# Patient Record
Sex: Male | Born: 1965 | Race: White | Hispanic: No | Marital: Single | Smoking: Current every day smoker
Health system: Southern US, Community
[De-identification: ages and names within clinical notes are randomized; demographics above are authoritative.]

## PROBLEM LIST (undated history)

## (undated) DIAGNOSIS — Z720 Tobacco use: Secondary | ICD-10-CM

## (undated) DIAGNOSIS — E785 Hyperlipidemia, unspecified: Secondary | ICD-10-CM

## (undated) DIAGNOSIS — K219 Gastro-esophageal reflux disease without esophagitis: Secondary | ICD-10-CM

## (undated) DIAGNOSIS — M542 Cervicalgia: Secondary | ICD-10-CM

## (undated) DIAGNOSIS — G8929 Other chronic pain: Secondary | ICD-10-CM

## (undated) DIAGNOSIS — Z9289 Personal history of other medical treatment: Secondary | ICD-10-CM

## (undated) HISTORY — DX: Other chronic pain: G89.29

## (undated) HISTORY — DX: Hyperlipidemia, unspecified: E78.5

## (undated) HISTORY — DX: Personal history of other medical treatment: Z92.89

## (undated) HISTORY — DX: Tobacco use: Z72.0

## (undated) HISTORY — DX: Gastro-esophageal reflux disease without esophagitis: K21.9

## (undated) HISTORY — DX: Cervicalgia: M54.2

---

## 2000-10-11 ENCOUNTER — Encounter: Payer: Self-pay | Admitting: Internal Medicine

## 2000-10-11 ENCOUNTER — Ambulatory Visit (HOSPITAL_COMMUNITY): Admission: RE | Admit: 2000-10-11 | Discharge: 2000-10-11 | Payer: Self-pay | Admitting: Internal Medicine

## 2000-12-12 ENCOUNTER — Encounter: Admission: RE | Admit: 2000-12-12 | Discharge: 2001-01-01 | Payer: Self-pay | Admitting: Neurology

## 2003-01-18 ENCOUNTER — Encounter: Admission: RE | Admit: 2003-01-18 | Discharge: 2003-01-18 | Payer: Self-pay | Admitting: Occupational Medicine

## 2005-10-31 ENCOUNTER — Emergency Department (HOSPITAL_COMMUNITY): Admission: EM | Admit: 2005-10-31 | Discharge: 2005-10-31 | Payer: Self-pay | Admitting: Family Medicine

## 2007-07-08 ENCOUNTER — Emergency Department (HOSPITAL_COMMUNITY): Admission: EM | Admit: 2007-07-08 | Discharge: 2007-07-08 | Payer: Self-pay | Admitting: Emergency Medicine

## 2008-04-13 ENCOUNTER — Encounter: Admission: RE | Admit: 2008-04-13 | Discharge: 2008-04-13 | Payer: Self-pay | Admitting: Internal Medicine

## 2009-03-05 DIAGNOSIS — Z9289 Personal history of other medical treatment: Secondary | ICD-10-CM

## 2009-03-05 HISTORY — DX: Personal history of other medical treatment: Z92.89

## 2009-11-25 ENCOUNTER — Emergency Department (HOSPITAL_COMMUNITY): Admission: EM | Admit: 2009-11-25 | Discharge: 2009-11-25 | Payer: Self-pay | Admitting: Family Medicine

## 2009-11-25 ENCOUNTER — Emergency Department (HOSPITAL_COMMUNITY): Admission: EM | Admit: 2009-11-25 | Discharge: 2009-11-25 | Payer: Self-pay | Admitting: Emergency Medicine

## 2010-05-09 ENCOUNTER — Emergency Department (HOSPITAL_COMMUNITY)
Admission: EM | Admit: 2010-05-09 | Discharge: 2010-05-10 | Discharge status: Against Medical Advice | Payer: Self-pay | Attending: Emergency Medicine | Admitting: Emergency Medicine

## 2010-05-09 DIAGNOSIS — M79609 Pain in unspecified limb: Secondary | ICD-10-CM | POA: Insufficient documentation

## 2010-05-18 LAB — DIFFERENTIAL
Basophils Absolute: 0.1 10*3/uL (ref 0.0–0.1)
Basophils Relative: 1 % (ref 0–1)
Eosinophils Absolute: 0.2 10*3/uL (ref 0.0–0.7)
Eosinophils Relative: 3 % (ref 0–5)
Lymphocytes Relative: 38 % (ref 12–46)
Lymphs Abs: 2.6 10*3/uL (ref 0.7–4.0)
Monocytes Absolute: 0.7 10*3/uL (ref 0.1–1.0)
Monocytes Relative: 10 % (ref 3–12)
Neutro Abs: 3.3 10*3/uL (ref 1.7–7.7)
Neutrophils Relative %: 49 % (ref 43–77)

## 2010-05-18 LAB — POCT I-STAT, CHEM 8
BUN: 8 mg/dL (ref 6–23)
Calcium, Ion: 1.19 mmol/L (ref 1.12–1.32)
Chloride: 102 mEq/L (ref 96–112)
Creatinine, Ser: 0.8 mg/dL (ref 0.4–1.5)
Glucose, Bld: 96 mg/dL (ref 70–99)
HCT: 46 % (ref 39.0–52.0)
Hemoglobin: 15.6 g/dL (ref 13.0–17.0)
Potassium: 4.3 mEq/L (ref 3.5–5.1)
Sodium: 141 mEq/L (ref 135–145)
TCO2: 29 mmol/L (ref 0–100)

## 2010-05-18 LAB — POCT CARDIAC MARKERS
CKMB, poc: <1 ng/mL — ABNORMAL LOW (ref 1.0–8.0)
Myoglobin, poc: 47.1 ng/mL (ref 12–200)
Troponin i, poc: <0.05 ng/mL (ref 0.00–0.09)

## 2010-05-18 LAB — CBC
HCT: 42 % (ref 39.0–52.0)
Hemoglobin: 15.2 g/dL (ref 13.0–17.0)
MCH: 32.5 pg (ref 26.0–34.0)
MCHC: 36.2 g/dL — ABNORMAL HIGH (ref 30.0–36.0)
MCV: 89.7 fL (ref 78.0–100.0)
Platelets: 179 10*3/uL (ref 150–400)
RBC: 4.68 MIL/uL (ref 4.22–5.81)
RDW: 12.5 % (ref 11.5–15.5)
WBC: 6.8 10*3/uL (ref 4.0–10.5)

## 2010-05-18 LAB — HEPATIC FUNCTION PANEL
ALT: 27 U/L (ref 0–53)
AST: 23 U/L (ref 0–37)
Albumin: 3.8 g/dL (ref 3.5–5.2)
Alkaline Phosphatase: 51 U/L (ref 39–117)
Bilirubin, Direct: 0.1 mg/dL (ref 0.0–0.3)
Indirect Bilirubin: 0.5 mg/dL (ref 0.3–0.9)
Total Bilirubin: 0.6 mg/dL (ref 0.3–1.2)
Total Protein: 7 g/dL (ref 6.0–8.3)

## 2010-05-18 LAB — CK: Total CK: 35 U/L (ref 7–232)

## 2010-09-12 ENCOUNTER — Encounter: Payer: Self-pay | Admitting: Family Medicine

## 2010-09-12 DIAGNOSIS — Z720 Tobacco use: Secondary | ICD-10-CM | POA: Insufficient documentation

## 2010-09-12 DIAGNOSIS — K219 Gastro-esophageal reflux disease without esophagitis: Secondary | ICD-10-CM | POA: Insufficient documentation

## 2011-07-02 ENCOUNTER — Encounter: Payer: Self-pay | Admitting: Medical

## 2011-07-02 ENCOUNTER — Ambulatory Visit (INDEPENDENT_AMBULATORY_CARE_PROVIDER_SITE_OTHER): Payer: Managed Care, Other (non HMO) | Admitting: Medical

## 2011-07-02 VITALS — BP 100/60 | HR 58 | Temp 97.8°F | Resp 16 | Ht 65.5 in | Wt 135.0 lb

## 2011-07-02 DIAGNOSIS — M542 Cervicalgia: Secondary | ICD-10-CM

## 2011-07-02 DIAGNOSIS — E782 Mixed hyperlipidemia: Secondary | ICD-10-CM

## 2011-07-02 MED ORDER — NAPROXEN 500 MG PO TABS: 500.0000 mg | ORAL_TABLET | Freq: Two times a day (BID) | ORAL | Status: DC

## 2011-07-02 MED ORDER — CYCLOBENZAPRINE HCL 5 MG PO TABS: ORAL_TABLET | ORAL | Status: DC

## 2011-07-02 NOTE — Progress Notes (Signed)
Subjective:   HPI  Kevin Gibbs is a 46 y.o. male who presents as a new patient. Was seeing [facility] in [place] prior, but they closed their practice.  Thus, here to establish care as well as new c/o.  He notes left sided neck pain x 3 weeks, though it could be pulled muscle or pinched nerve.  He notes pain on left, radiates to head, gives bad headache, worse pickup up heavy objects.  Not particularly worse with neck ROM.  Chewing makes it worse at times.  Denies numbness or tingling in arms, no pain down arms.  He does note hx/o shoulder problem with finger numbness in remote past, but that resolved.  Denies neck or back injury.  Using Ibuprofen OTC 200mg , 3 tablets 3-4 times daily.  He does note hx/o crick in neck/wry neck in the past. Denies injury or trauma.  He has hx/o high cholesterol, on medication, last check several months ago.  No other aggravating or relieving factors.    No other c/o.  The following portions of the patient's history were reviewed and updated as appropriate: allergies, current medications, past family history, past medical history, past social history, past surgical history and problem list.  Past Medical History  Diagnosis Date  . GERD (gastroesophageal reflux disease)   . Tobacco abuse   . Hyperlipidemia   . Migraine     last migrain in teens  . 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    Objective:   Physical Exam  General appearance: alert, no distress, WD/WN Oral cavity: MMM, no lesions Neck: supple, no lymphadenopathy, no thyromegaly, no masses, normal ROM, mild tenderness left posterior neck Heart: RRR, normal S1, S2, no murmurs Lungs: CTA bilaterally, no wheezes, rhonchi, or rales Pulses: 2+ symmetric Neuro: UE DTRs normal, sensation normal MSK: mild tenderness left trapezius, otherwise back, shoulders, UE  nontender, rest of UE unremarkable  Assessment and Plan :     Encounter Diagnoses  Name Primary?  . Cervicalgia Yes  . Mixed dyslipidemia    Cervicalgia - advised ROM exercises and stretching prior, during and after work, exercise in general, use heat prn, scripts for Flexeril and Naprosyn today.  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  Consider seeing a massage therapist.   Recheck if not improving.    Mixed dyslipidemia - he will return at his convenience for recheck including CMET, Lipid.

## 2011-08-01 ENCOUNTER — Encounter: Payer: Self-pay | Admitting: Medical

## 2011-08-01 ENCOUNTER — Ambulatory Visit (INDEPENDENT_AMBULATORY_CARE_PROVIDER_SITE_OTHER): Payer: Managed Care, Other (non HMO) | Admitting: Medical

## 2011-08-01 VITALS — BP 112/80 | HR 56 | Temp 97.7°F | Resp 16 | Wt 127.0 lb

## 2011-08-01 DIAGNOSIS — M542 Cervicalgia: Secondary | ICD-10-CM

## 2011-08-01 DIAGNOSIS — E785 Hyperlipidemia, unspecified: Secondary | ICD-10-CM

## 2011-08-01 DIAGNOSIS — F17200 Nicotine dependence, unspecified, uncomplicated: Secondary | ICD-10-CM

## 2011-08-01 DIAGNOSIS — R634 Abnormal weight loss: Secondary | ICD-10-CM

## 2011-08-01 LAB — COMPREHENSIVE METABOLIC PANEL
ALT: 15 U/L (ref 0–53)
AST: 19 U/L (ref 0–37)
Albumin: 4.5 g/dL (ref 3.5–5.2)
Alkaline Phosphatase: 50 U/L (ref 39–117)
BUN: 15 mg/dL (ref 6–23)
CO2: 27 mEq/L (ref 19–32)
Calcium: 9.1 mg/dL (ref 8.4–10.5)
Chloride: 106 mEq/L (ref 96–112)
Creat: 0.63 mg/dL (ref 0.50–1.35)
Glucose, Bld: 96 mg/dL (ref 70–99)
Potassium: 4.4 mEq/L (ref 3.5–5.3)
Sodium: 138 mEq/L (ref 135–145)
Total Bilirubin: 1 mg/dL (ref 0.3–1.2)
Total Protein: 6.4 g/dL (ref 6.0–8.3)

## 2011-08-01 LAB — LIPID PANEL
Cholesterol: 126 mg/dL (ref 0–200)
HDL: 39 mg/dL — ABNORMAL LOW (ref >39)
LDL Cholesterol: 73 mg/dL (ref 0–99)
Total CHOL/HDL Ratio: 3.2 Ratio
Triglycerides: 70 mg/dL (ref <150)
VLDL: 14 mg/dL (ref 0–40)

## 2011-08-02 ENCOUNTER — Encounter: Payer: Self-pay | Admitting: Medical

## 2011-08-02 LAB — CBC
HCT: 42.4 % (ref 39.0–52.0)
Hemoglobin: 14.6 g/dL (ref 13.0–17.0)
MCH: 33.2 pg (ref 26.0–34.0)
MCHC: 34.4 g/dL (ref 30.0–36.0)
MCV: 96.4 fL (ref 78.0–100.0)
Platelets: 184 10*3/uL (ref 150–400)
RBC: 4.4 MIL/uL (ref 4.22–5.81)
RDW: 13.5 % (ref 11.5–15.5)
WBC: 8.2 10*3/uL (ref 4.0–10.5)

## 2011-08-02 NOTE — Progress Notes (Signed)
Subjective:   HPI  Kevin Gibbs is a 46 y.o. male who presents for recheck.  I saw him as a new patient recently for neck pain.  I had advised he work on daily stretching and ROM exercise prior to, during, and after work. He works as a Curator.  I also prescribed muscle relaxer and NSAID.  He has been using the stretching and ROM with improvement.  Has used the medication some with improvement.    He is here for fasting labs given his hx/o hyperlipidemia.  He is compliant with his medication.  Was originally put on medication for elevated triglycerides 1.5 years ago.  His mother has hx/o heart disease.  No other heart disease in the family.   In the last several months he notes decreased appetite and weight loss.  Had been going through some stress and mood had been down.  No additional weight loss at current.   No other aggravating or relieving factors.    No other c/o.  The following portions of the patient's history were reviewed and updated as appropriate: allergies, current medications, past family history, past medical history, past social history, past surgical history and problem list.  Past Medical History  Diagnosis Date  . GERD (gastroesophageal reflux disease)   . Tobacco abuse   . Hyperlipidemia   . Migraine     last migrain in teens  . 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    Objective:   Physical Exam  General appearance: alert, no distress, WD/WN, lean white male Skin: multiple tattoos Oral cavity: MMM, no lesions Neck: supple,nontender today, mild pain with ROM, but relatively full ROM, no lymphadenopathy, no thyromegaly, no masses Heart: RRR, normal S1, S2, no murmurs Lungs: CTA bilaterally, no wheezes, rhonchi, or rales Abdomen: +bs, soft, non tender, non distended, no masses, no hepatomegaly, no splenomegaly Pulses: 2+  symmetric Back: nontender MSK: UE nontender, normal ROM   Assessment and Plan :     Encounter Diagnoses  Name Primary?  . Hyperlipidemia Yes  . Current smoker   . Cervicalgia   . Weight loss    Hyperlipidemia - labs today  Current smoker - discussed dangers to tobacco use, advised smoking cessation  Cervicalgia - improved using daily stretching and ROM.  Uses muscle relaxer and NSAID periodically but not daily.  He will call/return if worsening.  Weight loss - baseline labs today.  He notes recent stressors and decreased mood and subsequent decreased appetite that let to the weight loss, but will c/t to watch this.

## 2011-08-03 ENCOUNTER — Other Ambulatory Visit: Payer: Self-pay | Admitting: Family Medicine

## 2011-08-03 DIAGNOSIS — E785 Hyperlipidemia, unspecified: Secondary | ICD-10-CM

## 2011-09-04 ENCOUNTER — Telehealth: Payer: Self-pay | Admitting: Medical

## 2011-09-04 ENCOUNTER — Other Ambulatory Visit: Payer: Self-pay | Admitting: Medical

## 2011-09-04 MED ORDER — CYCLOBENZAPRINE HCL 5 MG PO TABS: ORAL_TABLET | ORAL | Status: DC

## 2011-09-04 NOTE — Telephone Encounter (Signed)
Lmom notifying the patient that his RX was sent to the pharmacy. CLS

## 2011-09-04 NOTE — Telephone Encounter (Signed)
rx sent

## 2011-11-23 ENCOUNTER — Ambulatory Visit (INDEPENDENT_AMBULATORY_CARE_PROVIDER_SITE_OTHER): Payer: Managed Care, Other (non HMO) | Admitting: Family Medicine

## 2011-11-23 ENCOUNTER — Encounter: Payer: Self-pay | Admitting: Family Medicine

## 2011-11-23 VITALS — BP 112/72 | HR 60 | Wt 129.0 lb

## 2011-11-23 DIAGNOSIS — G8929 Other chronic pain: Secondary | ICD-10-CM

## 2011-11-23 DIAGNOSIS — M542 Cervicalgia: Secondary | ICD-10-CM

## 2011-11-23 DIAGNOSIS — H00019 Hordeolum externum unspecified eye, unspecified eyelid: Secondary | ICD-10-CM

## 2011-11-23 DIAGNOSIS — Z23 Encounter for immunization: Secondary | ICD-10-CM

## 2011-11-23 DIAGNOSIS — E785 Hyperlipidemia, unspecified: Secondary | ICD-10-CM

## 2011-11-23 LAB — LIPID PANEL
Cholesterol: 246 mg/dL — ABNORMAL HIGH (ref 0–200)
HDL: 36 mg/dL — ABNORMAL LOW (ref >39)
LDL Cholesterol: 187 mg/dL — ABNORMAL HIGH (ref 0–99)
Total CHOL/HDL Ratio: 6.8 Ratio
Triglycerides: 113 mg/dL (ref <150)
VLDL: 23 mg/dL (ref 0–40)

## 2011-11-23 NOTE — Progress Notes (Signed)
  Subjective:    Patient ID: Kevin Gibbs, male    DOB: August 22, 1965, 46 y.o.   MRN: [id number]  HPI He is here for followup on neck pain and cholesterol. On his last visit his medication for cholesterol was stopped. He is here to get baseline information. He also continues to have difficulty with neck pain. It dates to at least 2002 when he apparently injured his neck at work. Review his record indicates no MRI of the C-spine was done in 2002. Since then he has treating this with stretching and range of motion. He was seen in July and given Flexeril. He will use the Flexeril for several days until the neck pain diminishes and then will not use it again for several months He also has a stye in the left eye and needs this evaluated.  Review of Systems     Objective:   Physical Exam Alert and in no distress. Good motion of the neck. Normal strength is noted in the arms with normal sensation. Very small stye is noted in the left lateral upper eyelid.     Assessment & Plan:   1. Hyperlipidemia  Lipid panel  2. Chronic neck pain    3. Stye    recommend continue with present conservative treatment for his neck pain. I will renew his Flexeril on an as-needed basis since he is using very little of it. Recommend warm soaks for the eye. Flu shot given. Risk and benefits discussed.

## 2011-11-26 ENCOUNTER — Other Ambulatory Visit: Payer: Self-pay | Admitting: *Deleted

## 2011-11-26 DIAGNOSIS — E7800 Pure hypercholesterolemia, unspecified: Secondary | ICD-10-CM

## 2011-11-26 DIAGNOSIS — E782 Mixed hyperlipidemia: Secondary | ICD-10-CM

## 2011-11-26 MED ORDER — ATORVASTATIN CALCIUM 20 MG PO TABS: 20.0000 mg | ORAL_TABLET | Freq: Every day | ORAL | Status: DC

## 2011-11-26 NOTE — Telephone Encounter (Signed)
Called in lipitor 20mg  to [facility] [address]-also scheduled him for 01/30/12 for fasting lipids.

## 2012-01-30 ENCOUNTER — Encounter: Payer: Self-pay | Admitting: Medical

## 2012-01-30 ENCOUNTER — Ambulatory Visit (INDEPENDENT_AMBULATORY_CARE_PROVIDER_SITE_OTHER): Payer: Managed Care, Other (non HMO) | Admitting: Medical

## 2012-01-30 ENCOUNTER — Other Ambulatory Visit: Payer: Managed Care, Other (non HMO)

## 2012-01-30 ENCOUNTER — Telehealth: Payer: Self-pay | Admitting: Family Medicine

## 2012-01-30 VITALS — BP 92/70 | HR 68 | Temp 97.9°F | Resp 16 | Ht 65.5 in | Wt 131.0 lb

## 2012-01-30 DIAGNOSIS — Z23 Encounter for immunization: Secondary | ICD-10-CM

## 2012-01-30 DIAGNOSIS — M542 Cervicalgia: Secondary | ICD-10-CM

## 2012-01-30 DIAGNOSIS — E785 Hyperlipidemia, unspecified: Secondary | ICD-10-CM

## 2012-01-30 DIAGNOSIS — G8929 Other chronic pain: Secondary | ICD-10-CM

## 2012-01-30 DIAGNOSIS — F17200 Nicotine dependence, unspecified, uncomplicated: Secondary | ICD-10-CM

## 2012-01-30 DIAGNOSIS — Z0000 Encounter for general adult medical examination without abnormal findings: Secondary | ICD-10-CM

## 2012-01-30 LAB — POCT URINALYSIS DIPSTICK
Bilirubin, UA: NEGATIVE
Blood, UA: NEGATIVE
Glucose, UA: NEGATIVE
Ketones, UA: NEGATIVE
Leukocytes, UA: NEGATIVE
Nitrite, UA: NEGATIVE
Protein, UA: NEGATIVE
Spec Grav, UA: <=1.005
Urobilinogen, UA: NEGATIVE
pH, UA: 7

## 2012-01-30 MED ORDER — CYCLOBENZAPRINE HCL 5 MG PO TABS: ORAL_TABLET | ORAL | Status: DC

## 2012-01-30 NOTE — Progress Notes (Addendum)
Subjective:   HPI  Kevin Gibbs is a 46 y.o. male who presents for a complete physical.  Preventative care: Last ophthalmology visit: 2013/Dr. Mcfarland Last dental visit:n/a Last colonoscopy:n/a Last prostate exam: never Last YNW:GNFAOZ test a few years ago Last labs:11/26/11  Prior vaccinations: TD or Tdap:2 to 3 years ago Influenza:11/2011 Pneumococcal:n/a Shingles/Zostavax:n/a  Concerns: Recheck on cholesterol today, compliant with medication  Ongoing neck pain, intermittent, uses flexeril occasionally. No arm numbness, tingling, weakness.  Reviewed their medical, surgical, family, social, medication, and allergy history and updated chart as appropriate.   Past Medical History  Diagnosis Date  . GERD (gastroesophageal reflux disease)   . Tobacco abuse   . Hyperlipidemia   . Migraine     last migrain in teens  . 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 History of exercise stress test 2011    per pt, reportedly normal  . Chronic neck pain     History reviewed. No pertinent past surgical history.  Family History  Problem Relation Age of Onset  . Diabetes Mother   . Hypertension Mother   . Heart disease Mother 75    CHF  . Heart disease Father     died of CHF  . Stroke Neg Hx   . Cancer Neg Hx     History   Social History  . Marital Status: Single    Spouse Name: N/A    Number of Children: N/A  . Years of Education: N/A   Occupational History  . Museum/gallery exhibitions officer course   Social History Main Topics  . Smoking status: Current Every Day Smoker -- 1.0 packs/day for 25 years    Types: Cigarettes  . Smokeless tobacco: Not on file  . Alcohol Use: 1.2 oz/week    2 Shots of liquor per week  . Drug Use: No  . Sexually Active: Not on file   Other Topics Concern  . Not on file   Social History Narrative   Single, in a relationship, has no children, exercising with walking, on the go all the time, cuts wood, yard work.  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 atorvastatin (LIPITOR) 20 MG tablet Take 1 tablet (20 mg total) by mouth daily.  30 tablet  1  . naproxen (NAPROSYN) 500 MG tablet Take 1 tablet (500 mg total) by mouth 2 (two) times daily with a meal.  30 tablet  0  . [DISCONTINUED] cyclobenzaprine (FLEXERIL) 5 MG tablet Use 1 tablet QHS or up to BID prn  20 tablet  0    No Known Allergies   Review of Systems Constitutional: -fever, -chills, -sweats, -unexpected weight change, -decreased appetite, -fatigue Allergy: -sneezing, -itching, -congestion Dermatology: -changing moles, --rash, -lumps ENT: -runny nose, -ear pain, -sore throat, -hoarseness, -sinus pain, -teeth pain, - ringing in ears, -hearing loss, -nosebleeds Cardiology: -chest pain, -palpitations, -swelling, -difficulty breathing when lying flat, -waking up short of breath Respiratory: -cough, -shortness of breath, -difficulty breathing with exercise or exertion, -wheezing, -coughing up blood Gastroenterology: -abdominal pain, -nausea, -vomiting, -diarrhea, -constipation, -blood in stool, -changes in bowel movement, -difficulty swallowing or eating Hematology: -bleeding, -bruising  Musculoskeletal: -joint aches, -muscle aches, -joint swelling, -back pain, -neck pain, -cramping, -changes in gait Ophthalmology: denies vision  changes, eye redness, itching, discharge Urology: -burning with urination, -difficulty urinating, -blood in urine, -urinary frequency, -urgency, -incontinence Neurology: -headache, -weakness, -tingling, -numbness, -memory loss, -falls, -dizziness Psychology: -depressed mood, -agitation, -sleep problems     Objective:   Physical Exam  Reviewed nurse notes and vitals  General appearance: alert, no distress, WD/WN, white male Skin: tattoos bilat arms, upper left back,  left 3rd toe dorsal proximal phalanx with small flat brown 3mm macule HEENT: normocephalic, conjunctiva/corneas normal, sclerae anicteric, PERRLA, EOMi, nares patent, no discharge or erythema, pharynx normal Oral cavity: MMM, tongue normal, teeth with stain, some caries noted Neck: supple, no lymphadenopathy, no thyromegaly, no masses, normal ROM, no bruits Chest: non tender, normal shape and expansion Heart: RRR, normal S1, S2, no murmurs Lungs: CTA bilaterally, no wheezes, rhonchi, or rales Abdomen: +bs, soft, non tender, non distended, no masses, no hepatomegaly, no splenomegaly, no bruits Back: non tender, normal ROM, no scoliosis Musculoskeletal: upper extremities non tender, no obvious deformity, normal ROM throughout, lower extremities non tender, no obvious deformity, normal ROM throughout Extremities: no edema, no cyanosis, no clubbing Pulses: 2+ symmetric, upper and lower extremities, normal cap refill Neurological: alert, oriented x 3, CN2-12 intact, strength normal upper extremities and lower extremities, sensation normal throughout, DTRs 2+ throughout, no cerebellar signs, gait normal Psychiatric: normal affect, behavior normal, pleasant  GU: normal male external genitalia, nontender, small spermatocele right inferior scrotum, no masses, no hernia, no lymphadenopathy Rectal: deferred   Assessment and Plan :      Encounter Diagnoses  Name Primary?  . Routine general medical examination at a health care facility Yes  . Hyperlipidemia   . Tobacco use disorder   . Chronic neck pain   . Need for pneumococcal vaccination     Physical exam - discussed healthy lifestyle, diet, exercise, preventative care, vaccinations, and addressed their concerns.  We will try and get copy of prior cardiology records.   Hyperlipidemia - on Lipitor.  Labs today.   Was on simcor prior with good results but changed due to costs.  Tobacco use - again advised he quit.  discussed risks.  He is not  ready to quit at this time.  Chronic neck pain - refilled flexeril which helps.  In near future, we can get updated C spine xray.  Pneumococcal vaccine, VIS and counseling given.   Follow-up pending labs, repeat first morning urine for trace protein in 2-3 wk.

## 2012-01-30 NOTE — Telephone Encounter (Signed)
Message copied by Janeice Robinson on Wed Jan 30, 2012  3:50 PM ------      Message from: Aleen Campi, DAVID S      Created: Wed Jan 30, 2012 12:35 PM       pls call [facility] and/or [facility] about prior stress test - need copy

## 2012-01-30 NOTE — Telephone Encounter (Signed)
Done

## 2012-01-31 LAB — LIPID PANEL
Cholesterol: 143 mg/dL (ref 0–200)
HDL: 37 mg/dL — ABNORMAL LOW (ref >39)
LDL Cholesterol: 89 mg/dL (ref 0–99)
Total CHOL/HDL Ratio: 3.9 Ratio
Triglycerides: 86 mg/dL (ref <150)
VLDL: 17 mg/dL (ref 0–40)

## 2012-01-31 LAB — ALT: ALT: 17 U/L (ref 0–53)

## 2012-01-31 LAB — VITAMIN D 25 HYDROXY (VIT D DEFICIENCY, FRACTURES): Vit D, 25-Hydroxy: 30 ng/mL (ref 30–89)

## 2012-02-04 ENCOUNTER — Other Ambulatory Visit: Payer: Self-pay | Admitting: Medical

## 2012-02-04 DIAGNOSIS — E7800 Pure hypercholesterolemia, unspecified: Secondary | ICD-10-CM

## 2012-02-04 MED ORDER — ATORVASTATIN CALCIUM 20 MG PO TABS: 20.0000 mg | ORAL_TABLET | Freq: Every day | ORAL | Status: DC

## 2012-02-18 ENCOUNTER — Ambulatory Visit (INDEPENDENT_AMBULATORY_CARE_PROVIDER_SITE_OTHER): Payer: Managed Care, Other (non HMO) | Admitting: Medical

## 2012-02-18 ENCOUNTER — Encounter: Payer: Self-pay | Admitting: Medical

## 2012-02-18 VITALS — BP 100/60 | HR 58 | Temp 98.1°F | Resp 14 | Wt 135.0 lb

## 2012-02-18 DIAGNOSIS — Z720 Tobacco use: Secondary | ICD-10-CM

## 2012-02-18 DIAGNOSIS — J329 Chronic sinusitis, unspecified: Secondary | ICD-10-CM

## 2012-02-18 DIAGNOSIS — F17200 Nicotine dependence, unspecified, uncomplicated: Secondary | ICD-10-CM

## 2012-02-18 LAB — POCT RAPID STREP A (OFFICE): Rapid Strep A Screen: NEGATIVE

## 2012-02-18 MED ORDER — AMOXICILLIN 875 MG PO TABS: 875.0000 mg | ORAL_TABLET | Freq: Two times a day (BID) | ORAL | Status: DC

## 2012-02-18 NOTE — Progress Notes (Signed)
Subjective:  Kevin Gibbs is a 46 y.o. male who presents for 1 wk hx/o illness.  Having sinus pressure, face hurts, teeth pain, left ear pain, some sore throat and cough, moving into chest.  He is a smoker.  Sick contacts at home as well.  Using severe cold and allergy medication OTC with no relief.  Avoids nasal saline as this causes him to gag and vomit. No chills, body aches, fever.  No other aggravating or relieving factors.  No other c/o.  Past Medical History  Diagnosis Date  . GERD (gastroesophageal reflux disease)   . Tobacco abuse   . Hyperlipidemia   . Migraine     last migrain in teens  . 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 History of exercise stress test 2011    per pt, reportedly normal  . Chronic neck pain    ROS as in HPI  Objective:   Filed Vitals:   02/18/12 1026  BP: 100/60  Pulse: 58  Temp: 98.1 F (36.7 C)  Resp: 14    General appearance: Alert, WD/WN, no distress                             Skin: warm, no rash                           Head: +frontal sinus tenderness,                            Eyes: conjunctiva normal, corneas clear, PERRLA                            Ears: pearly TMs, external ear canals normal                          Nose: septum midline, turbinates swollen, with erythema and clear discharge             Mouth/throat: MMM, tongue normal, mild pharyngeal erythema                           Neck: supple, no adenopathy, no thyromegaly, nontender                          Heart: RRR, normal S1, S2, no murmurs                         Lungs: CTA bilaterally, no wheezes, rales, or rhonchi      Assessment and Plan:   Encounter Diagnoses  Name Primary?  . Sinusitis Yes  . Tobacco use    Prescription given for Amoxicillin.  Can use OTC Mucinex for congestion, humidifier.  Tylenol or Ibuprofen OTC for fever and malaise.  Discussed symptomatic relief and call or return if worse or not improving in 2-3 days.

## 2012-02-22 ENCOUNTER — Ambulatory Visit: Payer: Managed Care, Other (non HMO) | Admitting: Family Medicine

## 2012-03-31 ENCOUNTER — Encounter: Payer: Self-pay | Admitting: Family Medicine

## 2012-03-31 ENCOUNTER — Ambulatory Visit (INDEPENDENT_AMBULATORY_CARE_PROVIDER_SITE_OTHER): Payer: Managed Care, Other (non HMO) | Admitting: Family Medicine

## 2012-03-31 VITALS — BP 100/70 | HR 58 | Wt 133.0 lb

## 2012-03-31 DIAGNOSIS — M542 Cervicalgia: Secondary | ICD-10-CM

## 2012-03-31 MED ORDER — PREDNISONE 10 MG PO KIT: PACK | ORAL | Status: DC

## 2012-03-31 NOTE — Progress Notes (Signed)
  Subjective:    Patient ID: Kevin Gibbs, male    DOB: 08-Apr-1965, 47 y.o.   MRN: [id number]  HPI 3 weeks ago he was starting an engine with a pull rope and experienced pain 2 days later in the right neck area. 7-10 days after that he noted radiation down the right arm into the thumb. He describes this as a tingling sensation no weakness hearing he's been using heat massage and ice   Review of Systems     Objective:   Physical Exam Alert and in no distress full motion of the neck. No tenderness to palpation. Compression test negative. Normal motor, sensory and DTRs.       Assessment & Plan:   1. Neck pain on right side  DG Cervical Spine Complete, PredniSONE 10 MG KIT   I will place him on a Sterapred dose pack. Discussed heat, range of motion exercises. Also discussed possible physical therapy as well as further testing with MRI. If no improvement with prednisone, he will return here in roughly 2 weeks.

## 2012-03-31 NOTE — Patient Instructions (Signed)
Heat for 20 minutes 3 times per day and after your done ,then do the stretching. As long as you  get better we are home free; if down the road after about 2 weeks you are not better,come back

## 2012-04-03 ENCOUNTER — Ambulatory Visit
Admission: RE | Admit: 2012-04-03 | Discharge: 2012-04-03 | Disposition: A | Discharge status: Home / Self Care | Payer: Managed Care, Other (non HMO) | Source: Ambulatory Visit | Attending: Family Medicine | Admitting: Family Medicine

## 2012-04-03 DIAGNOSIS — M542 Cervicalgia: Secondary | ICD-10-CM

## 2012-04-03 NOTE — Progress Notes (Signed)
Quick Note:  Let him know that he does have some degenerative changes. Have him call me next week to let he know how he is doing. ______

## 2012-04-04 NOTE — Progress Notes (Signed)
Quick Note:  Called pt to inform him of xray and he verbalized understanding and will call back next week ______

## 2012-04-14 ENCOUNTER — Telehealth: Payer: Self-pay | Admitting: Family Medicine

## 2012-04-14 NOTE — Telephone Encounter (Signed)
Have him set up an appointment for followup visit

## 2012-04-21 ENCOUNTER — Ambulatory Visit (INDEPENDENT_AMBULATORY_CARE_PROVIDER_SITE_OTHER): Payer: Managed Care, Other (non HMO) | Admitting: Family Medicine

## 2012-04-21 ENCOUNTER — Encounter: Payer: Self-pay | Admitting: Family Medicine

## 2012-04-21 VITALS — BP 106/70 | HR 61 | Wt 131.0 lb

## 2012-04-21 DIAGNOSIS — M542 Cervicalgia: Secondary | ICD-10-CM

## 2012-04-21 NOTE — Progress Notes (Signed)
  Subjective:    Patient ID: Kevin Gibbs, male    DOB: 27-Jun-1965, 47 y.o.   MRN: [id number]  HPI He is here for recheck. He continues to have stiffness in his neck especially on the right with tingling and numbness into his thumb. He was treated with a steroid Dosepak and this has had no benefit.   Review of Systems     Objective:   Physical Exam Alert and in no distress. Slight pain on right lateral neck motion. Normal sensory, motor and DTRs of the arms.       Assessment & Plan:  Cervicalgia discussed options including MRI versus physical therapy. I will proceed with an MRI and if unsuccessful we'll then order a physical therapy. He is in agreement with this.

## 2012-04-22 ENCOUNTER — Telehealth: Payer: Self-pay | Admitting: Family Medicine

## 2012-04-22 NOTE — Telephone Encounter (Signed)
I called pt to let him know Dr.Lalonde is sending him to p/t instead

## 2012-04-22 NOTE — Telephone Encounter (Signed)
Let's go ahead and set him up for physical therapy for his neck.

## 2012-04-23 ENCOUNTER — Other Ambulatory Visit: Payer: Managed Care, Other (non HMO)

## 2012-04-24 NOTE — Telephone Encounter (Signed)
PT INFORMED AND FAXED INFO TO [facility] TH[facility]OUGH P/T

## 2012-06-26 ENCOUNTER — Ambulatory Visit (INDEPENDENT_AMBULATORY_CARE_PROVIDER_SITE_OTHER): Payer: Managed Care, Other (non HMO) | Admitting: Family Medicine

## 2012-06-26 ENCOUNTER — Encounter: Payer: Self-pay | Admitting: Family Medicine

## 2012-06-26 VITALS — BP 140/70 | HR 68 | Wt 131.0 lb

## 2012-06-26 DIAGNOSIS — S60569A Insect bite (nonvenomous) of unspecified hand, initial encounter: Secondary | ICD-10-CM

## 2012-06-26 DIAGNOSIS — R590 Localized enlarged lymph nodes: Secondary | ICD-10-CM

## 2012-06-26 DIAGNOSIS — R599 Enlarged lymph nodes, unspecified: Secondary | ICD-10-CM

## 2012-06-26 DIAGNOSIS — S60562A Insect bite (nonvenomous) of left hand, initial encounter: Secondary | ICD-10-CM

## 2012-06-26 NOTE — Progress Notes (Signed)
  Subjective:    Patient ID: Kevin Gibbs, male    DOB: 1965/07/18, 47 y.o.   MRN: [id number]  HPI He had an insect bite on his left hand that is now healing. He also noted one on the left scrotal area and subsequent to this also noted swelling in the left inguinal area. He is having swelling as well as mainly itching at this point.   Review of Systems     Objective:   Physical Exam Exam of the hand shows a healing lesion at the base of the thumb. Left side of scrotum does show a healing lesion but there is no erythema warmth or tenderness. Testes normal. No hernia noted. Inguinal adenopathy is noted.       Assessment & Plan:  Lymphadenopathy, inguinal  Insect bite hand, left, initial encounter  I explained that this was probably related to inflammation and not infection. He is to treat this conservatively with heat, antihistamines and topical cortisone. If further difficulty, he is to call me.

## 2012-06-26 NOTE — Patient Instructions (Signed)
You can use heat on at 20 minutes a couple times per day. Use Benadryl at night for the itching and to Allegra or Claritin during the day. You can also use cortisone cream. If it gets worse give me a call

## 2012-07-01 NOTE — Progress Notes (Signed)
  Subjective:    Patient ID: Kevin Gibbs, male    DOB: 14-Sep-1965, 47 y.o.   MRN: [id number]  HPI    Review of Systems     Objective:   Physical Exam        Assessment & Plan:  The insect bite occurred approximately 5 days prior to his visit.

## 2012-07-28 ENCOUNTER — Emergency Department (HOSPITAL_COMMUNITY)
Admission: EM | Admit: 2012-07-28 | Discharge: 2012-07-28 | Disposition: A | Discharge status: Home / Self Care | Payer: Managed Care, Other (non HMO) | Attending: Emergency Medicine | Admitting: Emergency Medicine

## 2012-07-28 ENCOUNTER — Encounter (HOSPITAL_COMMUNITY): Payer: Self-pay | Admitting: Emergency Medicine

## 2012-07-28 DIAGNOSIS — Y9289 Other specified places as the place of occurrence of the external cause: Secondary | ICD-10-CM | POA: Insufficient documentation

## 2012-07-28 DIAGNOSIS — Y9389 Activity, other specified: Secondary | ICD-10-CM | POA: Insufficient documentation

## 2012-07-28 DIAGNOSIS — G8929 Other chronic pain: Secondary | ICD-10-CM | POA: Insufficient documentation

## 2012-07-28 DIAGNOSIS — Z7982 Long term (current) use of aspirin: Secondary | ICD-10-CM | POA: Insufficient documentation

## 2012-07-28 DIAGNOSIS — Z79899 Other long term (current) drug therapy: Secondary | ICD-10-CM | POA: Insufficient documentation

## 2012-07-28 DIAGNOSIS — F17200 Nicotine dependence, unspecified, uncomplicated: Secondary | ICD-10-CM | POA: Insufficient documentation

## 2012-07-28 DIAGNOSIS — W268XXA Contact with other sharp object(s), not elsewhere classified, initial encounter: Secondary | ICD-10-CM | POA: Insufficient documentation

## 2012-07-28 DIAGNOSIS — Z8679 Personal history of other diseases of the circulatory system: Secondary | ICD-10-CM | POA: Insufficient documentation

## 2012-07-28 DIAGNOSIS — S60459A Superficial foreign body of unspecified finger, initial encounter: Secondary | ICD-10-CM | POA: Insufficient documentation

## 2012-07-28 DIAGNOSIS — H9190 Unspecified hearing loss, unspecified ear: Secondary | ICD-10-CM | POA: Insufficient documentation

## 2012-07-28 DIAGNOSIS — M542 Cervicalgia: Secondary | ICD-10-CM | POA: Insufficient documentation

## 2012-07-28 DIAGNOSIS — Z8719 Personal history of other diseases of the digestive system: Secondary | ICD-10-CM | POA: Insufficient documentation

## 2012-07-28 DIAGNOSIS — S60450A Superficial foreign body of right index finger, initial encounter: Secondary | ICD-10-CM

## 2012-07-28 DIAGNOSIS — E785 Hyperlipidemia, unspecified: Secondary | ICD-10-CM | POA: Insufficient documentation

## 2012-07-28 MED ORDER — HYDROCODONE-ACETAMINOPHEN 5-325 MG PO TABS: 1.0000 | ORAL_TABLET | ORAL | Status: DC | PRN

## 2012-07-28 MED ORDER — AMOXICILLIN-POT CLAVULANATE 875-125 MG PO TABS
1.0000 | ORAL_TABLET | Freq: Once | ORAL | Status: AC
  Administered 2012-07-28: 1 via ORAL
  Filled 2012-07-28: qty 1

## 2012-07-28 MED ORDER — AMOXICILLIN-POT CLAVULANATE 875-125 MG PO TABS: 1.0000 | ORAL_TABLET | Freq: Two times a day (BID) | ORAL | Status: DC

## 2012-07-28 NOTE — ED Provider Notes (Signed)
Medical screening examination/treatment/procedure(s) were performed by non-physician practitioner and as supervising physician I was immediately available for consultation/collaboration.  Abdulla Pooley, MD 07/28/12 1157 

## 2012-07-28 NOTE — ED Notes (Signed)
Pt reports went fishing today and got a fish hook in his left pinky finger. Pt reports fish hook is new and he had last tetanus 3 years ago.

## 2012-07-28 NOTE — ED Provider Notes (Signed)
History     CSN: [id number]  Arrival date & time 07/28/12  0935   First MD Initiated Contact with Patient 07/28/12 [phone]      Chief Complaint  Patient presents with  . Foreign Body in Skin    (Consider location/radiation/quality/duration/timing/severity/associated sxs/prior treatment) HPI  Patient to the ED with complaints of fish hook to right index finger just prior to arrival. The fish hook got caught in the tree and while trying to get it out the branch fell causing the hook to go in his finger. It did not go all the way through and is partially in,. The hook was new but is dirty from fishing today. He had a tetanus shot 3 years ago. No allergies to abx, otherwise healthy.  Past Medical History  Diagnosis Date  . GERD (gastroesophageal reflux disease)   . Tobacco abuse   . Hyperlipidemia   . Migraine     last migrain in teens  . 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 History of exercise stress test 2011    per pt, reportedly normal  . Chronic neck pain     History reviewed. No pertinent past surgical history.  Family History  Problem Relation Age of Onset  . Diabetes Mother   . Hypertension Mother   . Heart disease Mother 65    CHF  . Heart disease Father     died of CHF  . Stroke Neg Hx   . Cancer Neg Hx     History  Substance Use Topics  . Smoking status: Current Every Day Smoker -- 1.00 packs/day for 25 years    Types: Cigarettes  . Smokeless tobacco: Not on file  . Alcohol Use: 1.2 oz/week    2 Shots of liquor per week      Review of Systems  All other systems reviewed and are negative.    Allergies  Review of patient's allergies indicates no known allergies.  Home Medications   Current Outpatient Rx  Name  Route  Sig  Dispense  Refill  . aspirin 81 MG tablet   Oral   Take 81 mg by mouth daily.         Marland Kitchen atorvastatin (LIPITOR) 20 MG tablet   Oral   Take 20 mg by mouth at bedtime.         . fish  oil-omega-3 fatty acids 1000 MG capsule   Oral   Take 1 g by mouth daily.         Marland Kitchen ibuprofen (ADVIL,MOTRIN) 200 MG tablet   Oral   Take 400 mg by mouth once.         Marland Kitchen VITAMIN D, CHOLECALCIFEROL, PO   Oral   Take 1 tablet by mouth daily.          Marland Kitchen amoxicillin-clavulanate (AUGMENTIN) 875-125 MG per tablet   Oral   Take 1 tablet by mouth every 12 (twelve) hours.   14 tablet   0   . HYDROcodone-acetaminophen (NORCO/VICODIN) 5-325 MG per tablet   Oral   Take 1 tablet by mouth every 4 (four) hours as needed for pain.   10 tablet   0     BP 139/91  Pulse 69  Temp(Src) 97.7 F (36.5 C) (Oral)  Resp 18  SpO2 98%  Physical Exam  Nursing note and vitals reviewed. Constitutional: He appears well-developed and well-nourished. No distress.  HENT:  Head: Normocephalic and atraumatic.  Eyes:  Pupils are equal, round, and reactive to light.  Neck: Normal range of motion. Neck supple.  Cardiovascular: Normal rate and regular rhythm.   Pulmonary/Chest: Effort normal.  Abdominal: Soft.  Musculoskeletal:       Hands: FROM of finger without pain before and after procedure. Radial and distal pulses are both physiologica  Neurological: He is alert.  Skin: Skin is warm and dry.    ED Course  FOREIGN BODY REMOVAL Date/Time: 07/28/2012 10:35 AM Performed by: Dorthula Matas Authorized by: Dorthula Matas Consent: Verbal consent obtained. Risks and benefits: risks, benefits and alternatives were discussed Consent given by: patient and spouse Patient understanding: patient states understanding of the procedure being performed Patient consent: the patient's understanding of the procedure matches consent given Time out: Immediately prior to procedure a "time out" was called to verify the correct patient, procedure, equipment, support staff and site/side marked as required. Body area: skin Anesthesia: local infiltration Local anesthetic: lidocaine 2% without  epinephrine Anesthetic total: 3 drops Patient sedated: no Removal mechanism: alligator forceps Tendon involvement: none Depth: deep 1 objects recovered. Objects recovered: 1 fish hook Post-procedure assessment: foreign body removed Patient tolerance: Patient tolerated the procedure well with no immediate complications.   (including critical care time)  Labs Reviewed - No data to display No results found.   1. Superficial foreign body of right index finger, initial encounter       MDM  Discussed with patient and spouse that he has high risk of infection to the finger. Even despite antibiotics infection is a complication of this sort of puncture wound. Augmentin abx given prophylactic ly.  Pt has been advised of the symptoms that warrant their return to the ED. Patient has voiced understanding and has agreed to follow-up with the PCP or specialist.         Dorthula Matas, PA-C 07/28/12 1038

## 2012-07-31 ENCOUNTER — Encounter: Payer: Self-pay | Admitting: Family Medicine

## 2012-07-31 ENCOUNTER — Ambulatory Visit (INDEPENDENT_AMBULATORY_CARE_PROVIDER_SITE_OTHER): Payer: Managed Care, Other (non HMO) | Admitting: Family Medicine

## 2012-07-31 VITALS — BP 110/60 | HR 57 | Wt 130.0 lb

## 2012-07-31 DIAGNOSIS — R590 Localized enlarged lymph nodes: Secondary | ICD-10-CM

## 2012-07-31 DIAGNOSIS — R599 Enlarged lymph nodes, unspecified: Secondary | ICD-10-CM

## 2012-07-31 DIAGNOSIS — T148 Other injury of unspecified body region: Secondary | ICD-10-CM

## 2012-07-31 DIAGNOSIS — W57XXXA Bitten or stung by nonvenomous insect and other nonvenomous arthropods, initial encounter: Secondary | ICD-10-CM

## 2012-07-31 NOTE — Progress Notes (Signed)
  Subjective:    Patient ID: Kevin Gibbs, male    DOB: [birth year], 47 y.o.   MRN: [id number]  HPI He is here for evaluation of the tick bite. He noticed this 2 days ago. It is in the left perianal area. It is causing some difficulty with itching. He also is noted some left inguinal swelling.   Review of Systems     Objective:   Physical Exam Exam of the perianal area does show a healing lesion with 2 cm of surrounding erythema. It is not warm or tender. Inguinal exam does show a slightly prominent left inguinal node that is round smooth and movable. No lesions were noted on the leg.       Assessment & Plan:  Tick bite  Inguinal adenopathy I discussed tick bite in regard to RMSF and Lyme disease. I did circle the lesion and asked him to call me if the erythema gets much worse. Explained that the adenopathy is nothing to be concerned about and not related to the tick bite.

## 2012-10-28 ENCOUNTER — Ambulatory Visit (INDEPENDENT_AMBULATORY_CARE_PROVIDER_SITE_OTHER): Payer: Managed Care, Other (non HMO) | Admitting: Medical

## 2012-10-28 ENCOUNTER — Encounter: Payer: Self-pay | Admitting: Medical

## 2012-10-28 VITALS — BP 118/76 | HR 56 | Temp 97.6°F | Resp 16 | Wt 131.0 lb

## 2012-10-28 DIAGNOSIS — R109 Unspecified abdominal pain: Secondary | ICD-10-CM

## 2012-10-28 DIAGNOSIS — R1030 Lower abdominal pain, unspecified: Secondary | ICD-10-CM

## 2012-10-28 LAB — POCT URINALYSIS DIPSTICK
Glucose, UA: NEGATIVE
Leukocytes, UA: NEGATIVE
Nitrite, UA: NEGATIVE
Urobilinogen, UA: NEGATIVE
pH, UA: 7

## 2012-10-28 NOTE — Progress Notes (Signed)
Subjective:  Kevin Gibbs is a 47 y.o. male who presents for left groin pain.  He reports pressure in left groin region.  Has been intermittent since he was seen for tick bites back in May.  Feels swollen in the area, but doesn't look swollen. Nothing seems to aggravate it particularly.  Left leg motions don't seem to interfere.  Denies hx/o hernia.  Denies swollen nodes.  Denies any stool or urination changes, no edema.  No fever, night sweats, no body aches.   No other aggravating or relieving factors.    No other c/o.  The following portions of the patient's history were reviewed and updated as appropriate: allergies, current medications, past family history, past medical history, past social history, past surgical history and problem list.  Review of Systems Constitutional: -fever, -chills, -sweats, -unexpected weight change,-fatigue Gastroenterology: -abdominal pain, -nausea, -vomiting, -diarrhea, -constipation Hematology: -bleeding or bruising problems Musculoskeletal: -arthralgias, -joint swelling, -back pain Urology: -dysuria, -difficulty urinating, -hematuria, -urinary frequency, -urgency Neurology: -headache, -weakness, -tingling, -numbness Skin: no new rash or unresolving rash  Objective: Physical Exam  Vital signs reviewed  General appearance: alert, no distress, WD/WN, lean white male Abdomen: +bs, soft, non tender, non distended, no masses, no hepatomegaly, no splenomegaly GU: normal male external genitalia, circumcised, no mass, no obvious hernia or lymphadenopathy MSK: legs, hips nontender, normal ROM, no obvious deformity Pulses: 2+ pedal pulses Ext: no LE edema   Assessment: Encounter Diagnosis  Name Primary?  . Groin pain Yes     Plan: Etiology unclear.  Possibly healing groin strain.   No obvious signs of infection, lymphadenopathy, hernia, mass, or rash.  Urinalysis today with trace blood.  Advised Aleve BID x 1 wk, relative rest.  If not improving in 1-2  wk, consider CT abdomen/pelvis.  Follow up: otherwise in November for physical

## 2012-12-19 ENCOUNTER — Other Ambulatory Visit (INDEPENDENT_AMBULATORY_CARE_PROVIDER_SITE_OTHER): Payer: Managed Care, Other (non HMO)

## 2012-12-19 DIAGNOSIS — Z23 Encounter for immunization: Secondary | ICD-10-CM

## 2013-01-28 ENCOUNTER — Other Ambulatory Visit: Payer: Self-pay | Admitting: Medical

## 2013-01-28 NOTE — Telephone Encounter (Signed)
PATIENT NEEDS TO SCHEDULE A OFFICE VISIT  AS SOON AS POSSIBLE IN ORDER TO RECEIVE ANY MORE REFILLS 

## 2013-02-04 ENCOUNTER — Ambulatory Visit (INDEPENDENT_AMBULATORY_CARE_PROVIDER_SITE_OTHER): Payer: Managed Care, Other (non HMO) | Admitting: Medical

## 2013-02-04 ENCOUNTER — Encounter: Payer: Self-pay | Admitting: Medical

## 2013-02-04 VITALS — BP 118/80 | HR 64 | Temp 97.8°F | Resp 16 | Wt 136.0 lb

## 2013-02-04 DIAGNOSIS — F17200 Nicotine dependence, unspecified, uncomplicated: Secondary | ICD-10-CM

## 2013-02-04 DIAGNOSIS — J329 Chronic sinusitis, unspecified: Secondary | ICD-10-CM

## 2013-02-04 MED ORDER — LEVOFLOXACIN 500 MG PO TABS: 500.0000 mg | ORAL_TABLET | Freq: Every day | ORAL | Status: DC

## 2013-02-04 NOTE — Progress Notes (Signed)
Subjective:  Kevin Gibbs is a 47 y.o. male who presents for possible sinus infection.  Here with his girlfriend who is also sick.  He reports several day hx/o headache, stuffy head, hurts behind his eyes, cheek pain, teeth pain, ear pressure.   Denies fever, NVD.  Patient is a smoker.  Using OTC decongestant for symptoms.  No other aggravating or relieving factors.  No other c/o.  ROS as in subjective   Objective: Filed Vitals:   02/04/13 0817  BP: 118/80  Pulse: 64  Temp: 97.8 F (36.6 C)  Resp: 16    General appearance: Alert, WD/WN, no distress                             Skin: warm, no rash                           Head: +frontal and maxillary sinus tenderness,                            Eyes: conjunctiva normal, corneas clear, PERRLA                            Ears: pearly TMs, external ear canals normal                          Nose: septum midline, turbinates swollen, with erythema and mucoid discharge             Mouth/throat: MMM, tongue normal, mild pharyngeal erythema                           Neck: supple, no adenopathy, no thyromegaly, nontender                          Heart: RRR, normal S1, S2, no murmurs                         Lungs: CTA bilaterally, no wheezes, rales, or rhonchi      Assessment and Plan:   Encounter Diagnoses  Name Primary?  . Sinusitis Yes  . Tobacco use disorder     Prescription given for Levaquin.  Can use OTC Mucinex for congestion.  Tylenol or Ibuprofen OTC for fever and malaise.  Discussed symptomatic relief, nasal saline flush, and call or return if worse or not improving in 2-3 days.    Tobacco use disorder-he is contemplating quitting. We discussed efforts to quit, medication choices, and I encouraged him and his girlfriend both consider quitting at the same time. They can call back if they want to try Wellbutrin and Chantix.

## 2013-02-16 ENCOUNTER — Other Ambulatory Visit: Payer: Self-pay | Admitting: Medical

## 2013-02-16 ENCOUNTER — Telehealth: Payer: Self-pay | Admitting: Internal Medicine

## 2013-02-16 MED ORDER — LEVOFLOXACIN 500 MG PO TABS: 500.0000 mg | ORAL_TABLET | Freq: Every day | ORAL | Status: DC

## 2013-02-16 MED ORDER — METHYLPREDNISOLONE (PAK) 4 MG PO TABS: ORAL_TABLET | ORAL | Status: DC

## 2013-02-16 NOTE — Telephone Encounter (Signed)
Pt states he still has nasal congestion, cough and still sinus issues. He did not know if you could call in another antibiotic for him. Send to [facility]

## 2013-02-16 NOTE — Telephone Encounter (Signed)
i sent 1 more round of antibiotic and steroid dosepak.

## 2013-02-24 ENCOUNTER — Other Ambulatory Visit: Payer: Self-pay | Admitting: Medical

## 2013-03-23 ENCOUNTER — Other Ambulatory Visit: Payer: Self-pay | Admitting: Medical

## 2013-04-25 ENCOUNTER — Other Ambulatory Visit: Payer: Self-pay | Admitting: Medical

## 2013-05-01 ENCOUNTER — Encounter: Payer: Self-pay | Admitting: Medical

## 2013-05-01 ENCOUNTER — Institutional Professional Consult (permissible substitution): Payer: Managed Care, Other (non HMO) | Admitting: Medical

## 2013-05-01 ENCOUNTER — Ambulatory Visit (INDEPENDENT_AMBULATORY_CARE_PROVIDER_SITE_OTHER): Payer: Managed Care, Other (non HMO) | Admitting: Medical

## 2013-05-01 VITALS — BP 100/70 | HR 62 | Temp 97.6°F | Resp 16 | Wt 137.0 lb

## 2013-05-01 DIAGNOSIS — Z720 Tobacco use: Secondary | ICD-10-CM

## 2013-05-01 DIAGNOSIS — R51 Headache: Secondary | ICD-10-CM

## 2013-05-01 DIAGNOSIS — E782 Mixed hyperlipidemia: Secondary | ICD-10-CM

## 2013-05-01 DIAGNOSIS — F17200 Nicotine dependence, unspecified, uncomplicated: Secondary | ICD-10-CM

## 2013-05-01 DIAGNOSIS — H8110 Benign paroxysmal vertigo, unspecified ear: Secondary | ICD-10-CM

## 2013-05-01 DIAGNOSIS — K219 Gastro-esophageal reflux disease without esophagitis: Secondary | ICD-10-CM

## 2013-05-01 LAB — COMPREHENSIVE METABOLIC PANEL
ALT: 19 U/L (ref 0–53)
AST: 18 U/L (ref 0–37)
Albumin: 4.8 g/dL (ref 3.5–5.2)
Alkaline Phosphatase: 49 U/L (ref 39–117)
BUN: 12 mg/dL (ref 6–23)
CO2: 27 mEq/L (ref 19–32)
Calcium: 9.8 mg/dL (ref 8.4–10.5)
Chloride: 104 mEq/L (ref 96–112)
Creat: 0.69 mg/dL (ref 0.50–1.35)
Glucose, Bld: 87 mg/dL (ref 70–99)
Potassium: 5.1 mEq/L (ref 3.5–5.3)
Sodium: 138 mEq/L (ref 135–145)
Total Bilirubin: 0.7 mg/dL (ref 0.2–1.2)
Total Protein: 6.8 g/dL (ref 6.0–8.3)

## 2013-05-01 LAB — CBC
HCT: 42 % (ref 39.0–52.0)
Hemoglobin: 14.8 g/dL (ref 13.0–17.0)
MCH: 32.5 pg (ref 26.0–34.0)
MCHC: 35.2 g/dL (ref 30.0–36.0)
MCV: 92.3 fL (ref 78.0–100.0)
Platelets: 249 10*3/uL (ref 150–400)
RBC: 4.55 MIL/uL (ref 4.22–5.81)
RDW: 13.8 % (ref 11.5–15.5)
WBC: 9.8 10*3/uL (ref 4.0–10.5)

## 2013-05-01 LAB — LIPID PANEL
Cholesterol: 141 mg/dL (ref 0–200)
HDL: 43 mg/dL (ref >39)
LDL Cholesterol: 83 mg/dL (ref 0–99)
Total CHOL/HDL Ratio: 3.3 Ratio
Triglycerides: 73 mg/dL (ref <150)
VLDL: 15 mg/dL (ref 0–40)

## 2013-05-01 MED ORDER — VARENICLINE TARTRATE 0.5 MG X 11 & 1 MG X 42 PO MISC: ORAL | Status: DC

## 2013-05-01 MED ORDER — MECLIZINE HCL 25 MG PO TABS: 25.0000 mg | ORAL_TABLET | Freq: Three times a day (TID) | ORAL | Status: DC

## 2013-05-01 NOTE — Assessment & Plan Note (Signed)
Still smoking.  He knows he needs to do something about it, but just hasn't.

## 2013-05-01 NOTE — Assessment & Plan Note (Addendum)
Taking Lipitor 20mg  at night along with Aspirin 81mg  at bedtime.  Hasn't been taking as much OTC fish oil, trying to eat more healthy food in general.  Limiting red meat.

## 2013-05-01 NOTE — Progress Notes (Signed)
Subjective:   Kevin Gibbs is a 48 y.o. male presenting on 05/01/2013 with DISCUSS CHOLESTROL AND GET LAB WORK and Headache  Mixed dyslipidemia Taking Lipitor 20mg  at night along with Aspirin 81mg  at bedtime.  Hasn't been taking as much OTC fish oil, trying to eat more healthy food in general.  Limiting red meat.  GERD (gastroesophageal reflux disease) Improved since diet changes.  Not on medications for GERD at this time  Tobacco abuse Still smoking.  He knows he needs to do something about it, but just hasn't.       Taking Vit D some, not as much as last visit, not daily.  He was borderline low last check.   Been having some vertigo problems on and off the last month.  Has had this in the past. Sometimes when looking up under cars at work gets dizzy/room spinning . Not having these symptoms daily, but somewhat frequent this past month or so.  No numbness, tingling, weakness, vision or hearing changes.  Awoke with bad headache 2am.  Been doing a lot of sanding and drywall work.  Remodeling a bathroom.  Hasn't taken anything yet for this.  Usually uses Ibuprofen.  Has hx/o migraines, but no frequent headaches, just the one that started this morning.  No other aggravating or relieving factors.    No other complaint.  Review of Systems ROS as in subjective      Objective:     Filed Vitals:   05/01/13 1011  BP: 100/70  Pulse: 62  Temp: 97.6 F (36.4 C)  Resp: 16    General appearance: alert, no distress, WD/WN HEENT: normocephalic, sclerae anicteric, TMs with serous effusions bilat, nares patent, no discharge or erythema, pharynx normal Oral cavity: MMM, no lesions Neck: supple, no lymphadenopathy, no thyromegaly, no masses Heart: RRR, normal S1, S2, no murmurs Lungs: CTA bilaterally, no wheezes, rhonchi, or rales Abdomen: +bs, soft, non tender, non distended, no masses, no hepatomegaly, no splenomegaly Pulses: 2+ symmetric, upper and lower extremities, normal  cap refill      Assessment: Encounter Diagnoses  Name Primary?  . Mixed dyslipidemia Yes  . Headache(784.0)   . Benign paroxysmal positional vertigo   . Tobacco abuse   . GERD (gastroesophageal reflux disease)      Plan: Discussed low cholesterol diet, he has done fine on Lipitor, labs today, advised of the need to stop tobacco.   GERD is controlled with diet, glad to hear there are making diet changes.  We discussed Chantix, risks/benefits, and recommended he stop tobacco.  Specific recommendations for patient today:  Continue Lipitor at bedtime daily for cholesterol   Continue baby Aspirin 81mg  at bedtime with Lipitor daily for heart health  Continue Vitamin D OTC 600 units daily  Eat a healthy low cholesterol diet  Exercise regularly  Begin Meclizine 2-3 times daily, 1/2-1 tablet for vertigo  For the next few days, use Ibuprofen OTC 2-3 tablets up to every 6 hours short term for headache  I printed Chantix as a prescription to begin to help stop tobacco if your insurance will cover this.   Kevin Gibbs was seen today for discuss cholestrol and get lab work and headache.  Diagnoses and associated orders for this visit:  Mixed dyslipidemia - Lipid panel - Comprehensive metabolic panel - CBC  Headache(784.0)  Benign paroxysmal positional vertigo  Tobacco abuse  GERD (gastroesophageal reflux disease)  Other Orders - meclizine (ANTIVERT) 25 MG tablet; Take 1 tablet (25 mg total) by mouth  3 (three) times daily. - varenicline (CHANTIX STARTING MONTH PAK) 0.5 MG X 11 & 1 MG X 42 tablet; Take one 0.5 mg tablet by mouth once daily for 3 days, then increase to one 0.5 mg tablet twice daily for 4 days, then increase to one 1 mg tablet twice daily.    Return pending labs.

## 2013-05-01 NOTE — Assessment & Plan Note (Signed)
Improved since diet changes.  Not on medications for GERD at this time

## 2013-05-01 NOTE — Patient Instructions (Addendum)
Thank you for giving me the opportunity to serve you today.    Your diagnosis today includes: Encounter Diagnoses  Name Primary?  . Mixed dyslipidemia Yes  . Headache(784.0)   . Benign paroxysmal positional vertigo   . Tobacco abuse   . GERD (gastroesophageal reflux disease)      Specific recommendations today include:  Continue Lipitor at bedtime daily for cholesterol   Continue baby Aspirin 81mg  at bedtime with Lipitor daily for heart health  Continue Vitamin D OTC 600 units daily  Eat a healthy low cholesterol diet  Exercise regularly  Begin Meclizine 2-3 times daily, 1/2-1 tablet for vertigo  For the next few days, use Ibuprofen OTC 2-3 tablets up to every 6 hours short term for headache  I sent Chantix as a prescription to begin to help stop tobacco if your insurance will cover this.  Follow up: pending labs   I have included other useful information below for your review.  YOU CAN QUIT SMOKING!  Talk to your medical provider about using medicines to help you quit. These include nicotine replacement gum, lozenges, or skin patches.  Consider calling 1-800-QUIT-NOW, a toll free 24/7 hotline with free counseling to help you quit.  If you are ready to quit smoking or are thinking about it, congratulations! You have chosen to help yourself be healthier and live longer! There are lots of different ways to quit smoking. Nicotine gum, nicotine patches, a nicotine inhaler, or nicotine nasal spray can help with physical craving. Hypnosis, support groups, and medicines help break the habit of smoking. TIPS TO GET OFF AND STAY OFF CIGARETTES  Learn to predict your moods. Do not let a bad situation be your excuse to have a cigarette. Some situations in your life might tempt you to have a cigarette.   Ask friends and co-workers not to smoke around you.   Make your home smoke-free.   Never have "just one" cigarette. It leads to wanting another and another. Remind yourself of  your decision to quit.   On a card, make a list of your reasons for not smoking. Read it at least the same number of times a day as you have a cigarette. Tell yourself everyday, "I do not want to smoke. I choose not to smoke."   Ask someone at home or work to help you with your plan to quit smoking.   Have something planned after you eat or have a cup of coffee. Take a walk or get other exercise to perk you up. This will help to keep you from overeating.   Try a relaxation exercise to calm you down and decrease your stress. Remember, you may be tense and nervous the first two weeks after you quit. This will pass.   Find new activities to keep your hands busy. Play with a pen, coin, or rubber band. Doodle or draw things on paper.   Brush your teeth right after eating. This will help cut down the craving for the taste of tobacco after meals. You can try mouthwash too.   Try gum, breath mints, or diet candy to keep something in your mouth.  IF YOU SMOKE AND WANT TO QUIT:  Do not stock up on cigarettes. Never buy a carton. Wait until one pack is finished before you buy another.   Never carry cigarettes with you at work or at home.   Keep cigarettes as far away from you as possible. Leave them with someone else.   Never carry matches  or a lighter with you.   Ask yourself, "Do I need this cigarette or is this just a reflex?"   Bet with someone that you can quit. Put cigarette money in a piggy bank every morning. If you smoke, you give up the money. If you do not smoke, by the end of the week, you keep the money.   Keep trying. It takes 21 days to change a habit!  Document Released: 12/16/2008 Document Revised: 11/01/2010 Document Reviewed: 12/16/2008 [facility] Patient Information 2012 [place], [place].

## 2013-05-08 ENCOUNTER — Other Ambulatory Visit: Payer: Self-pay | Admitting: Family Medicine

## 2013-05-08 ENCOUNTER — Telehealth: Payer: Self-pay | Admitting: Internal Medicine

## 2013-05-08 MED ORDER — ATORVASTATIN CALCIUM 20 MG PO TABS: 20.0000 mg | ORAL_TABLET | Freq: Every day | ORAL | Status: DC

## 2013-05-08 NOTE — Telephone Encounter (Signed)
Pt needs a refill on his atorvastatin to [facility]

## 2013-05-08 NOTE — Telephone Encounter (Signed)
Rx refills sent. CLS 

## 2013-10-05 ENCOUNTER — Telehealth: Payer: Self-pay | Admitting: Medical

## 2013-10-05 MED ORDER — ATORVASTATIN CALCIUM 20 MG PO TABS
20.0000 mg | ORAL_TABLET | Freq: Every day | ORAL | Status: DC
Start: 2013-10-05 — End: 2014-01-25

## 2013-10-05 NOTE — Telephone Encounter (Signed)
refilled 

## 2013-12-18 ENCOUNTER — Other Ambulatory Visit: Payer: Self-pay

## 2013-12-29 ENCOUNTER — Ambulatory Visit (INDEPENDENT_AMBULATORY_CARE_PROVIDER_SITE_OTHER): Payer: Self-pay | Admitting: Medical

## 2013-12-29 ENCOUNTER — Encounter: Payer: Self-pay | Admitting: Medical

## 2013-12-29 ENCOUNTER — Other Ambulatory Visit (INDEPENDENT_AMBULATORY_CARE_PROVIDER_SITE_OTHER): Payer: Managed Care, Other (non HMO)

## 2013-12-29 VITALS — BP 98/60 | HR 52 | Temp 97.7°F | Resp 16 | Ht 66.0 in | Wt 141.0 lb

## 2013-12-29 DIAGNOSIS — Z23 Encounter for immunization: Secondary | ICD-10-CM

## 2013-12-29 DIAGNOSIS — Z008 Encounter for other general examination: Secondary | ICD-10-CM

## 2013-12-29 DIAGNOSIS — Z0289 Encounter for other administrative examinations: Secondary | ICD-10-CM

## 2013-12-29 MED ORDER — BUPROPION HCL ER (XL) 150 MG PO TB24: 150.0000 mg | ORAL_TABLET | Freq: Every day | ORAL | Status: DC

## 2013-12-29 NOTE — Progress Notes (Signed)
Commercial Driver Medical Examination   Kevin Gibbs is a 48 y.o. male who presents today for a commercial driver fitness determination physical exam.  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   Last DOT physical 2 years ago.  Medical care team includes:  Here with me Kristian CoveyShane Isaiah Torok, PA-C for primary care  The patient reports no problems.  Review of Systems A comprehensive review of systems was reviewed and noted as below:  Eye: - corrective lenses, -lasik surgery or other eye surgery, -glaucoma, -cataracts, -macular degeneration, -monocular vision, -medication for eye condition, -blurred vision,   Ears: -hearing problems, - hearing aids, -ear pain, -ear drainage, -ear fullness, -tinnitus, -recurrent ear infection, -previous ear surgery, - vertigo, -menieres disease  Endocrine: -polydipsia, -polyuria, -weight loss, -fainting, -dizziness, - altered or loss of consciousness, -hypoglycemia  Cardiovascular: -heart disease, -CHF, -heart attack, -cardiac stents, -bypass surgery, -other heart surgery, -hypertension, -blood clots, -pacemaker, -medications for heart condition, -chest pain, -SOB, -palpitations, -fainting, -dizziness, -dyspnea  Respiratory: -asthma, -COPD, other lung disease,+smoker, -chest tightness, - wheezing, -snoring, -daytime sleepiness, -sleep apnea or uses CPAP, -narcolepsy, -sleeping disorder  Allergy: -uncontrollable sneezing or allergy symptoms  Musculoskeletal: -missing body parts, -muscle disease, -bone disease, -spine injury, -low back pain, -medication for joints, bones, muscles or pain, -physical limitations, -joint pain, -neck pain, -limitations of neck ROM, -back surgery, orthopedic surgery, -rheumatologic condition, -gout  Neurologic: -neurologic disease, -dementia, -seizures, -parkinsons, -tremor, -memory problems, -weakness, -numbness, -tingling, -medication for neurologic condition, -medications for  sleep condition  Gastric: -abdominal pain, -chronic diarrhea or IBS, -uncontrollable nausea  Kidney/Renal: -hematuria, -dialysis, kidney disease, polycystic kidney disease  Psychiatric: -homicidal thoughts, -suicidal thoughts, -prior suicide attempts, -get into fights/hurting others, -memory or concentration problems, -delusions, -hallucinations, -hospitalization for mental health problem, -depression, -anxiety, -bipolar  Drug use: - none  Reviewed their medical, surgical, family, social, medication, and allergy history and updated chart as appropriate.      Objective:   Physical Exam   BP 98/60  Pulse 52  Temp(Src) 97.7 F (36.5 C) (Oral)  Resp 16  Ht 5\' 6"  (1.676 m)  Wt 141 lb (63.957 kg)  BMI 22.77 kg/m2   General appearance: alert, no distress, WD/WN, white male  Skin: tattoos bilat arms, upper left back, left 3rd toe dorsal proximal phalanx with small flat brown 3mm macule  HEENT: normocephalic, conjunctiva/corneas normal, sclerae anicteric, PERRLA, EOMi, nares patent, no discharge or erythema, pharynx normal  Oral cavity: MMM, tongue normal, teeth with stain, otherwise in good repair Neck: supple, no lymphadenopathy, no thyromegaly, no masses, normal ROM, no bruits  Chest: non tender, normal shape and expansion  Heart: RRR, normal S1, S2, no murmurs  Lungs: CTA bilaterally, no wheezes, rhonchi, or rales  Abdomen: +bs, soft, non tender, non distended, no masses, no hepatomegaly, no splenomegaly, no bruits  Back: non tender, normal ROM, no scoliosis  Musculoskeletal: upper extremities non tender, no obvious deformity, normal ROM throughout, lower extremities non tender, no obvious deformity, normal ROM throughout  Extremities: no edema, no cyanosis, no clubbing  Pulses: 2+ symmetric, upper and lower extremities, normal cap refill  Neurological: alert, oriented x 3, CN2-12 intact, strength normal upper extremities and lower extremities, sensation normal throughout, DTRs 2+  throughout, no cerebellar signs, gait normal  Psychiatric: normal affect, behavior normal, pleasant  GU: normal male external genitalia, circumcised, nontender, small spermatocele right inferior scrotum, no masses, no hernia, no lymphadenopathy  Rectal: deferred   Vision:  Uncorrected Corrected Horizontal  Field of Vision  Right Eye 20/25 n/a 90 degrees  Left Eye  20/30 n/a 90 degrees  Both Eyes  20/20 n/a    Applicant can recognize and distinguish among traffic control signals and devices showing standard red, green, and amber colors.  Monocular Vision?: No   Hearing: passed forced whisper at 10' bilat   Labs: Lab Results  Component Value Date   SPECGRAV 1.010 10/28/2012   PROTEINUR n 10/28/2012   BILIRUBINUR nb 10/28/2012      Assessment:    Healthy male exam.  Meets standards in [address]49 CFR 391.41;  qualifies for 2 year certificate.    Plan:    Medical examiners certificate completed and printed. Return as needed. See scanned documents.

## 2013-12-30 LAB — POCT URINALYSIS DIPSTICK
BILIRUBIN UA: NEGATIVE
Glucose, UA: NEGATIVE
KETONES UA: NEGATIVE
LEUKOCYTES UA: NEGATIVE
Nitrite, UA: NEGATIVE
PH UA: 6
PROTEIN UA: NEGATIVE
RBC UA: NEGATIVE
SPEC GRAV UA: 1.02
Urobilinogen, UA: NEGATIVE

## 2014-01-11 ENCOUNTER — Telehealth: Payer: Self-pay | Admitting: Medical

## 2014-01-11 NOTE — Telephone Encounter (Signed)
[facility]  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   I didn't have any paperwork from him to mail anywhere else.  If he has a state form or something for me to review and mail, let me know.

## 2014-01-11 NOTE — Telephone Encounter (Signed)
Pt state that DOT advised him that they have not received his DOT Physical info and it expires soon so it will need to be faxed to Kim at DOt @ [phone] ASAP if it is not going through electronically

## 2014-01-12 ENCOUNTER — Telehealth: Payer: Self-pay | Admitting: Family Medicine

## 2014-01-12 NOTE — Telephone Encounter (Signed)
Mort SawyersKim Snow called to fu on DOT physical info. I advised her of Shane's comments. She was upset and said  Info should have been faxed to the state anyway. I advised that guidelines changed and we have been submitting info online. She was still upset. She will call DOT to FU to see if they are checking website ang them insisting that she will fax info herself

## 2014-01-12 NOTE — Telephone Encounter (Signed)
Kim called very upset that we had sent pt DOT info to [employer].  She [facility].  She asked me where I thought it should go and I told her wherever the paper work said for it to go.  She said pt did not bring his own forms that we used ours.  I also explained that Vincenza HewsShane had to go to a special training for this and [employer] may be where they had taught him to send everything.   She was concerned that the request went back and forth.  I advised in the future to let me know and I will stop the presses and fix whatever the issue is.    Please advise [employer] vs [place] location?

## 2014-01-13 NOTE — Telephone Encounter (Signed)
Lafonda MossesDiana, please help me check on this.  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  They are either standard DOT which means I electronically submit the DOT physical within 24 hours and they get copies of the original forms to have on hand including wallet card ... OR  People show up with a mailing packet from the [place] DOT that gets mailed to the state.    When Mr. Kevin Gibbs came in, he had no paperwork and stated he was there for DOT physical.  Thus, I submitted his forms to the [employer] electronically as usual.    If there is some other process of submission, I am not aware of it.  Can you please help me on this.  We can ask Dr. Susann GivensLalonde and Dr. Lynelle DoctorKnapp if they know of another process, but these are the only 2 ways I'm familiar with.    Of note, Mr. Kevin Gibbs girlfriend Mrs. Kevin Gibbs has been at times rude to [place] and the staff, and in this case, we did nothing wrong.  So she needs to be aware, that we have done appropriate care, and we expect the same respect and courteous we have shown them.  I don't know what else we could have done differently with this.

## 2014-01-18 NOTE — Telephone Encounter (Signed)
Returned call to fiance Mort SawyersKim Snow regarding the DOT physical.  I had pulled our DOT info and Vincenza HewsShane is registered with the [employer] for DOT.  He has to send his reports there also.  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  I explained that she needed to know for future. That we are required to report to [employer] and pt would need to forward his copy to the state.  Kim continued to use inappropriate language and speak disrepectful things about Vincenza HewsShane.  I advised her that was unfair and I was not going to listen to her and she hung up the phone.

## 2014-01-25 ENCOUNTER — Other Ambulatory Visit: Payer: Self-pay | Admitting: Medical

## 2014-02-22 IMAGING — CR DG CERVICAL SPINE COMPLETE 4+V
6 series · 6 of 6 positions shown · non-contrast
Comparison: None.

CLINICAL DATA: Right-sided neck pain.  Right hand numbness.

CERVICAL SPINE - COMPLETE 4+ VIEW

[w c-spine lat]
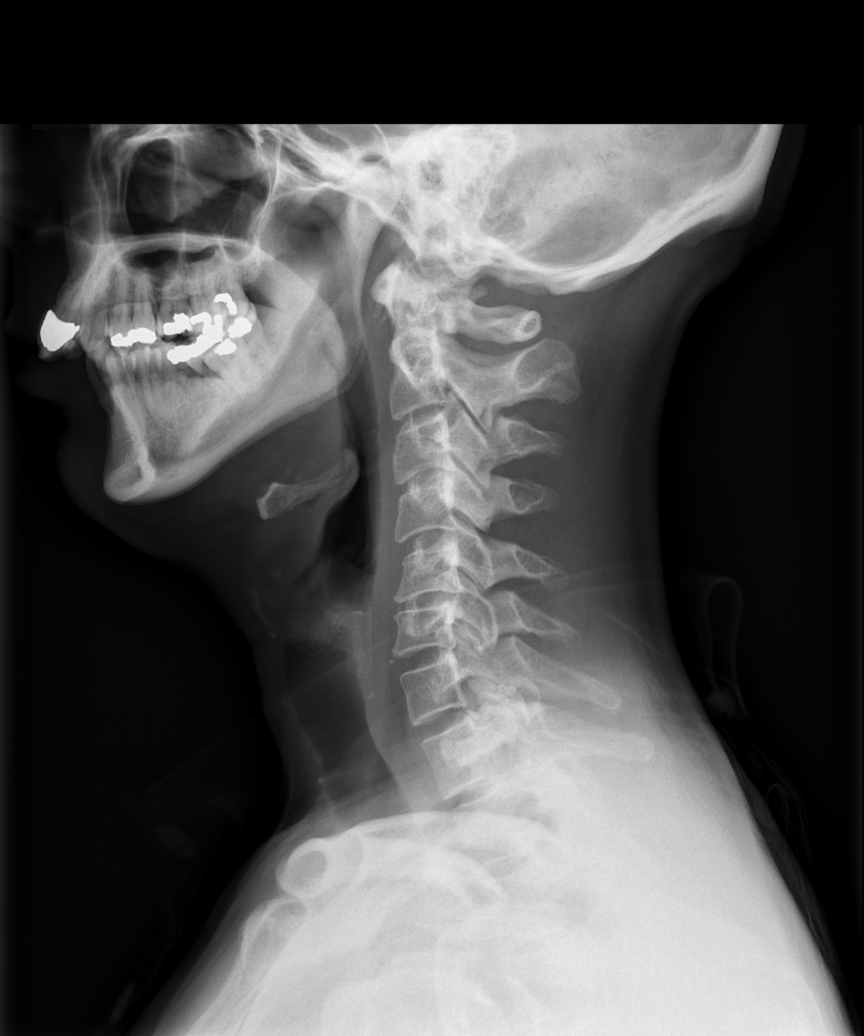

[w c-spine oblique (1 of 2)]
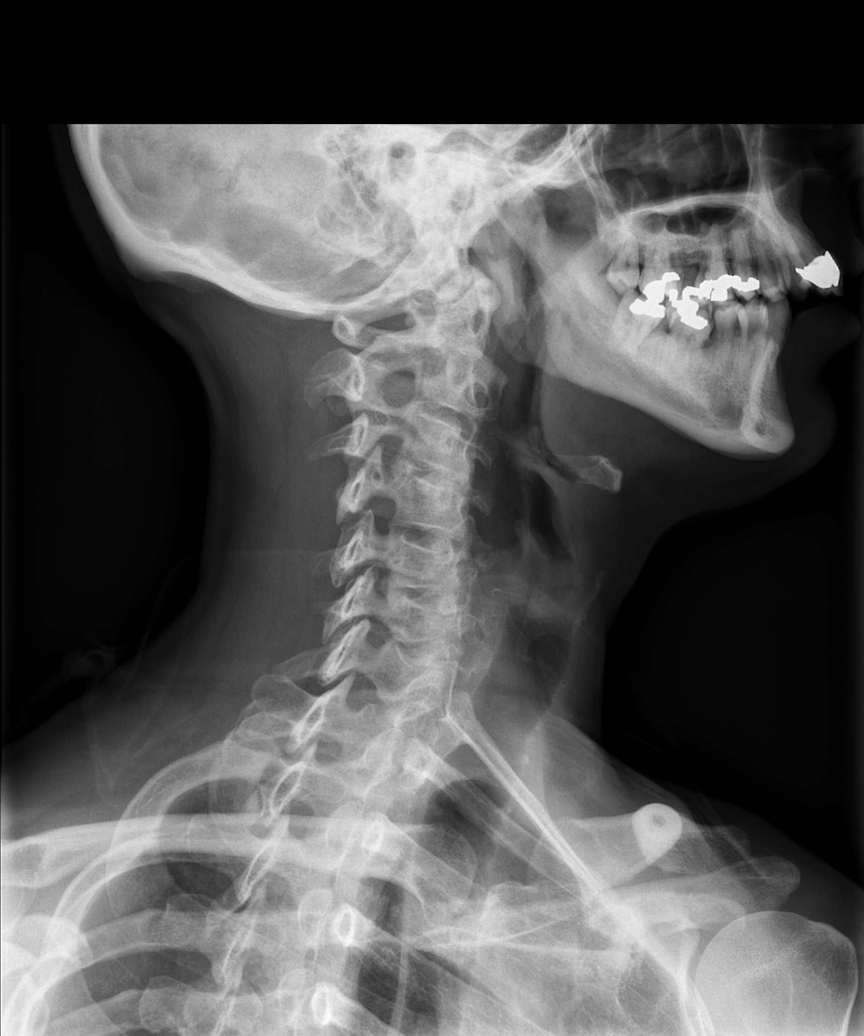

[w c-spine oblique (2 of 2)]
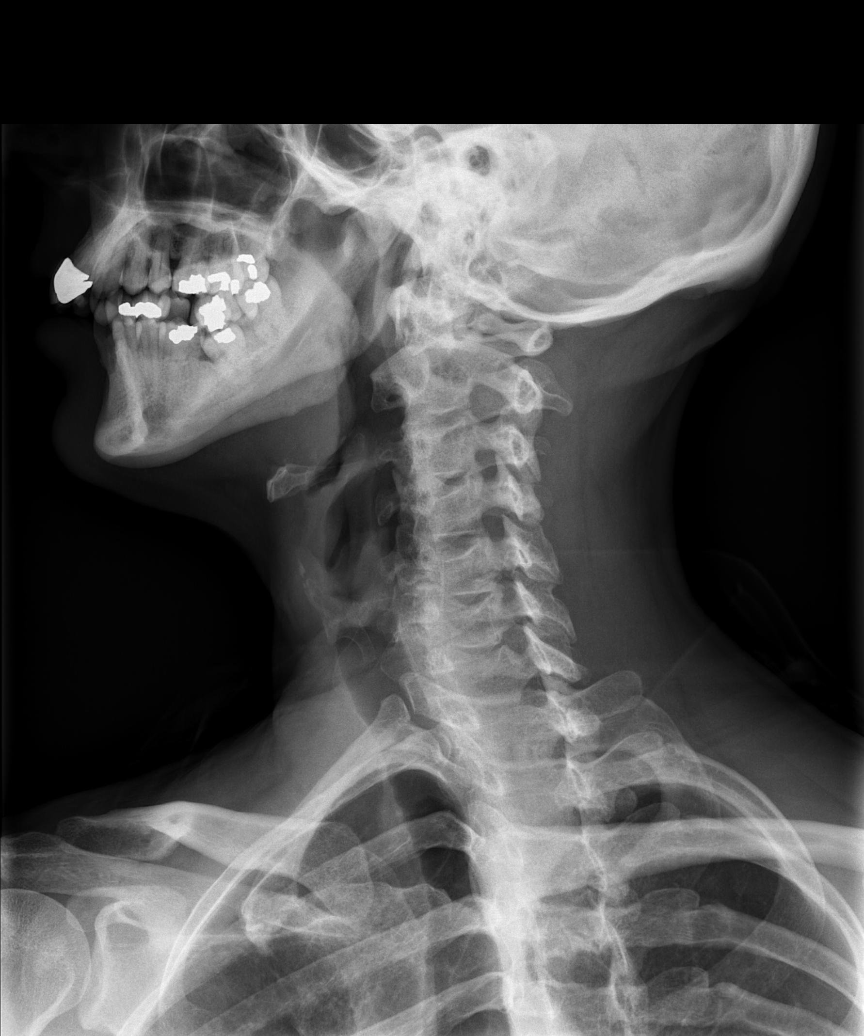

[w c-spine a.p. *]
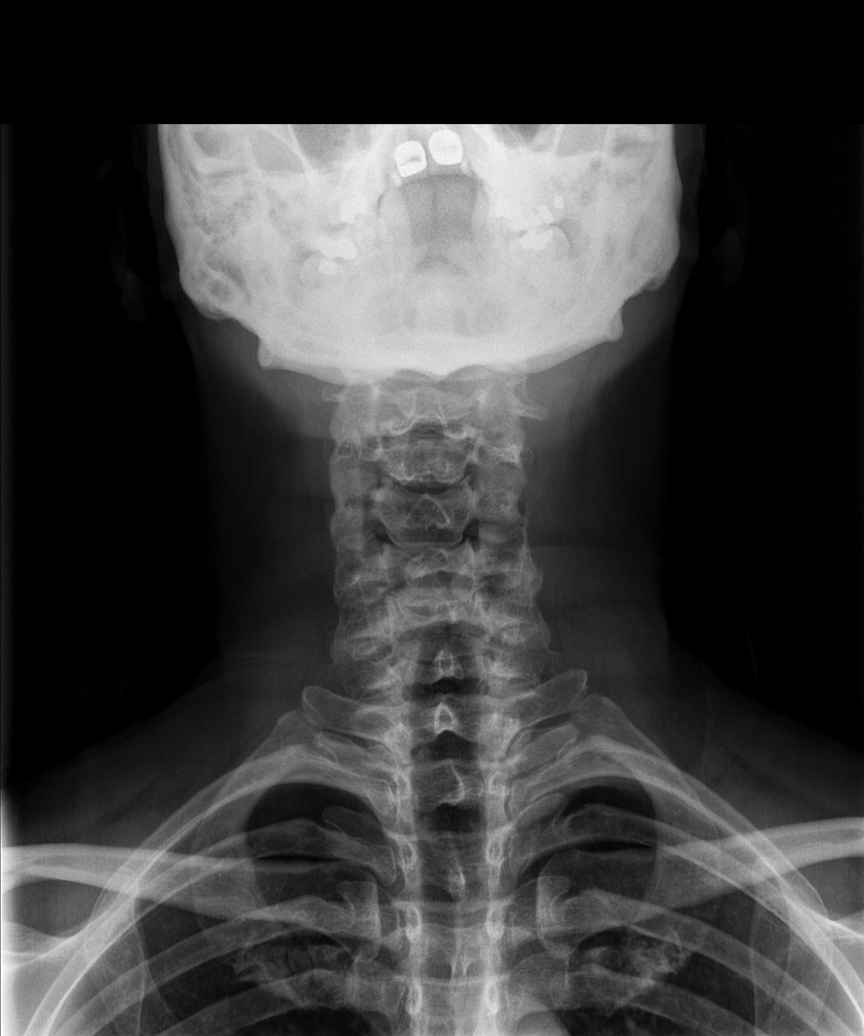

[w c-spine odontoid * (1 of 2)]
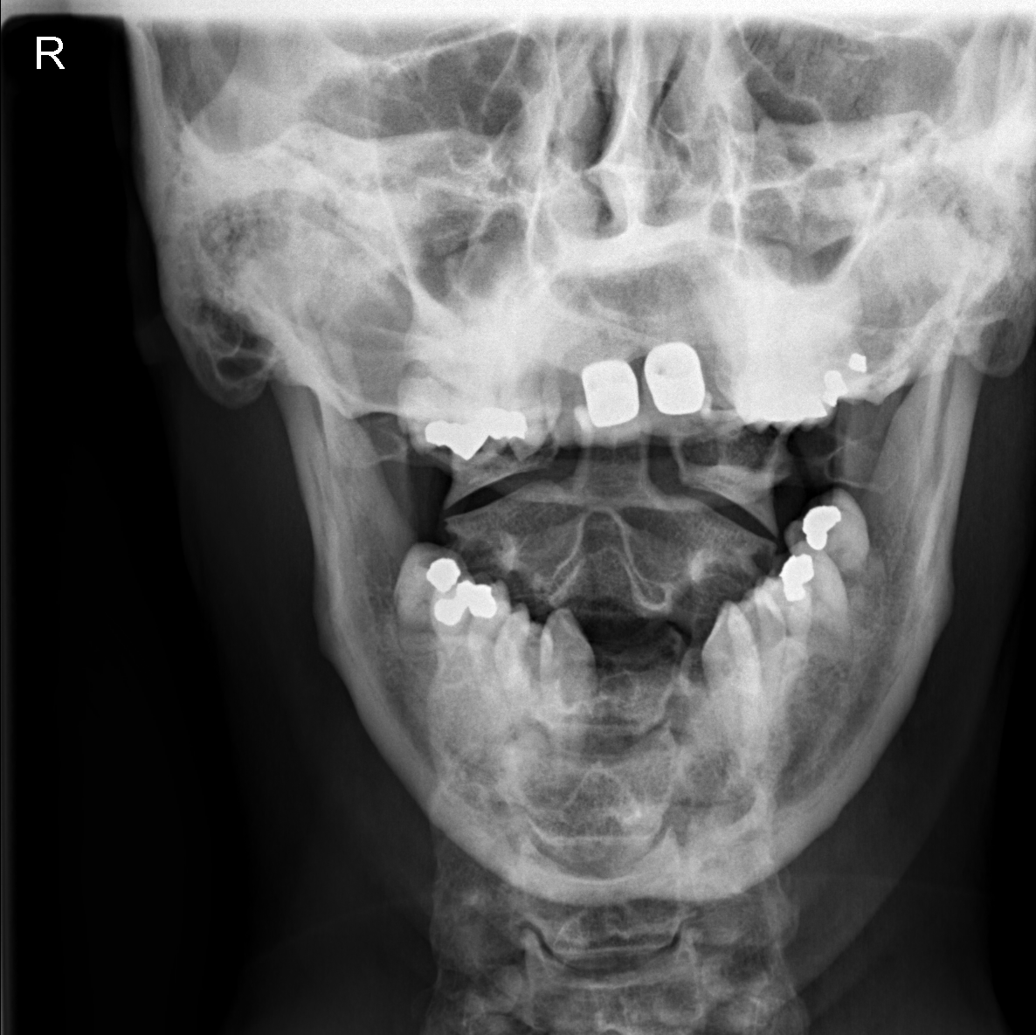

[w c-spine odontoid * (2 of 2)]
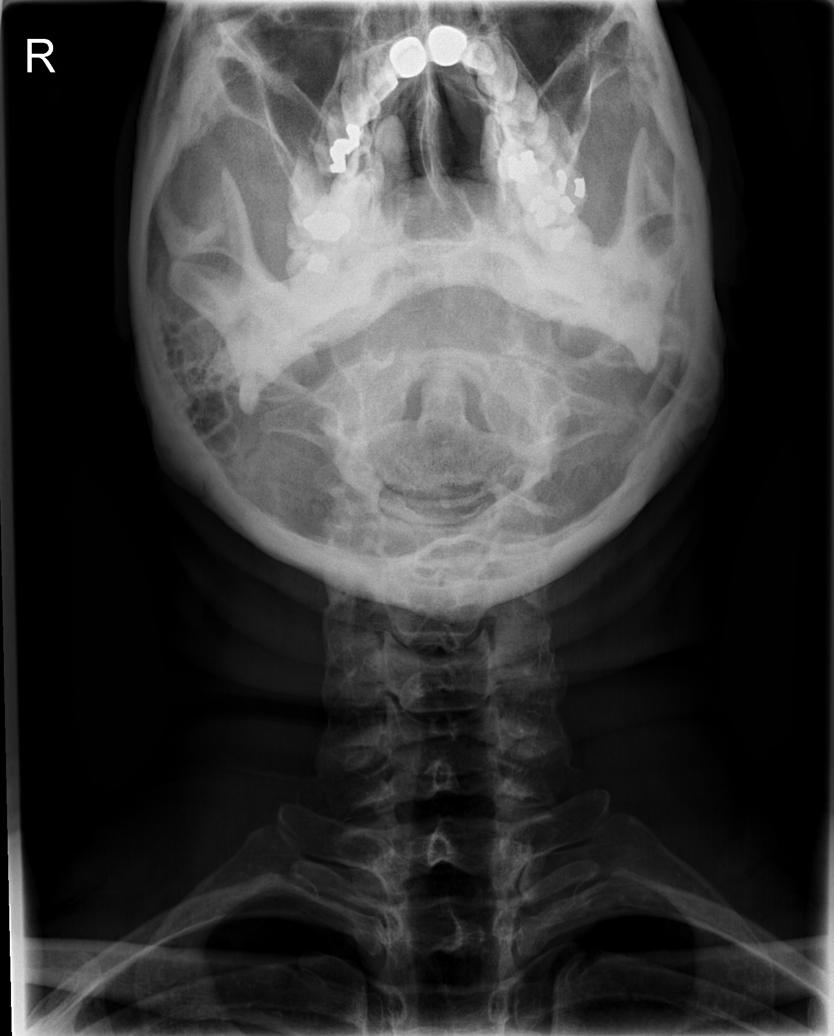

[6 of 6 positions shown; findings below may reference images not displayed]

FINDINGS: There is degenerative disc disease at C5-6 as indicated
by uncinate spurs extending  into the neural foramina, more on the
right than left. Minimal narrowing of the C5-6 disc space.  The
remainder of the cervical spine is essentially normal.  No
significant facet arthritis.
IMPRESSION: Degenerative disc disease at C5-6 with slight uncinate spurring
with bilateral foraminal narrowing, right greater than left.

## 2014-03-03 ENCOUNTER — Other Ambulatory Visit: Payer: Self-pay | Admitting: Medical

## 2014-03-03 NOTE — Telephone Encounter (Signed)
Is this okay to refill? 

## 2014-03-23 ENCOUNTER — Ambulatory Visit (INDEPENDENT_AMBULATORY_CARE_PROVIDER_SITE_OTHER): Payer: Managed Care, Other (non HMO) | Admitting: Medical

## 2014-03-23 ENCOUNTER — Encounter: Payer: Self-pay | Admitting: Medical

## 2014-03-23 VITALS — BP 122/80 | HR 52 | Temp 97.8°F | Resp 14 | Wt 139.0 lb

## 2014-03-23 DIAGNOSIS — J0110 Acute frontal sinusitis, unspecified: Secondary | ICD-10-CM

## 2014-03-23 MED ORDER — LEVOFLOXACIN 500 MG PO TABS: 500.0000 mg | ORAL_TABLET | Freq: Every day | ORAL | Status: DC

## 2014-03-23 MED ORDER — AZELASTINE-FLUTICASONE 137-50 MCG/ACT NA SUSP: 1.0000 | Freq: Two times a day (BID) | NASAL | Status: AC

## 2014-03-23 NOTE — Progress Notes (Signed)
Subjective:  Kevin Gibbs is a 49 y.o. male who presents for possible sinus infection.  Symptoms include runny nose, sneezing, coughing.  Yesterday whole left side of face, behind eye and nose just felt like being hit by ton of bricks.  +chills, sore throat on one side, left ear pain.  Denies body aches, fever, NV, SOB.  Past history is significant for hx/o sinusitis.  Using benadryl for symptoms.  Denies sick contacts.  No other aggravating or relieving factors.  No other c/o.  ROS as in subjective   Objective: Filed Vitals:   03/23/14 0859  BP: 122/80  Pulse: 52  Temp: 97.8 F (36.6 C)  Resp: 14    General appearance: Alert, WD/WN, no distress                             Skin: warm, no rash                           Head: + left maxillary and frontal sinus tenderness,                            Eyes: conjunctiva normal, corneas clear, PERRLA                            Ears: TMs with serous effusions, L>R, external ear canals normal                          Nose: septum midline, turbinates swollen, with erythema and mucoid discharge             Mouth/throat: MMM, tongue normal, mild pharyngeal erythema                           Neck: supple, no adenopathy, no thyromegaly, nontender                          Heart: RRR, normal S1, S2, no murmurs                         Lungs: CTA bilaterally, no wheezes, rales, or rhonchi      Assessment and Plan: Encounter Diagnosis  Name Primary?  . Acute frontal sinusitis, recurrence not specified Yes    Prescription given for Levaquin, Dymista nasal.  Can use OTC Benadryl for congestion.  Tylenol or Ibuprofen OTC for fever and malaise.  Discussed symptomatic relief, nasal saline flush, and call or return if worse or not improving in 2-3 days.

## 2014-04-27 ENCOUNTER — Other Ambulatory Visit: Payer: Self-pay | Admitting: Medical

## 2014-07-08 ENCOUNTER — Encounter: Payer: Self-pay | Admitting: Internal Medicine

## 2014-07-21 ENCOUNTER — Telehealth: Payer: Self-pay | Admitting: Internal Medicine

## 2014-07-21 NOTE — Telephone Encounter (Signed)
Faxed over medical records to [facility] on 07/08/14 @ [phone]

## 2015-12-13 ENCOUNTER — Ambulatory Visit (INDEPENDENT_AMBULATORY_CARE_PROVIDER_SITE_OTHER): Payer: Self-pay | Admitting: Physician Assistant

## 2015-12-13 VITALS — BP 118/74 | HR 59 | Temp 97.5°F | Resp 17 | Ht 66.0 in | Wt 141.0 lb

## 2015-12-13 DIAGNOSIS — Z0289 Encounter for other administrative examinations: Secondary | ICD-10-CM

## 2015-12-13 NOTE — Progress Notes (Addendum)
Patient ID: Kevin Gibbs, male   DOB: [birth year], 50 y.o.   MRN: [id number] [facility] [address] [place] [address] [phone]  By signing my name below, I, Essence Howell, attest that this documentation has been prepared under the direction and in the presence of Trena Platt, PA-C Electronically Signed: Charline Bills, ED Scribe 12/13/2015 at 4:03 PM.  Date:  12/13/2015   Name:  Kevin Gibbs   DOB:  December 10, 1965   MRN:  [id number]  PCP:  Carollee Herter, MD   History of Present Illness:  Kevin Gibbs is a 50 y.o. male patient who presents to [facility] for a DOT physical. Pt is a current smoker; smokes 1 ppd. He is using a patch for smoking cessation and is able to go 3-4 days without a cigarette at max. Pt states that he plans to discuss starting Chantix with his PCP. Pt has 1 cold beer/week.  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 He currently works at [employer] Pt has not seen an eye doctor in several years. He denies leg swelling, chest pain, palpitations, sob, blood in stools, melena.   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  Social History  Substance Use Topics   Smoking status: Current Every Day Smoker    Packs/day: 1.00    Years: 25.00    Types: Cigarettes   Smokeless tobacco: Not on file   Alcohol use 1.2 oz/week    2 Shots of liquor per week    Family History  Problem Relation Age of Onset   Diabetes Mother     Hypertension Mother    Heart disease Mother 38    CHF   Heart disease Father     died of CHF   Stroke Neg Hx    Cancer Neg Hx     Allergies  Allergen Reactions   Amoxicillin     Upset stomach   Augmentin [Amoxicillin-Pot Clavulanate]     Upset stomach    Medication list has been reviewed and updated.  Current Outpatient Prescriptions on File Prior to Visit  Medication Sig Dispense Refill   aspirin 81 MG tablet Take 81 mg by mouth daily.     Azelastine-Fluticasone (DYMISTA) 137-50 MCG/ACT SUSP Place 1 spray into the nose 2 (two) times daily. 1 Bottle 0   fish oil-omega-3 fatty acids 1000 MG capsule Take 1 g by mouth daily.     ibuprofen (ADVIL,MOTRIN) 200 MG tablet Take 400 mg by mouth once.     meclizine (ANTIVERT) 25 MG tablet TAKE 1 TABLET (25 MG TOTAL) BY MOUTH 3 (THREE) TIMES DAILY. 45 tablet 0   VITAMIN D, CHOLECALCIFEROL, PO Take 1 tablet by mouth daily.      No current facility-administered medications on file prior to visit.     Review of Systems  Respiratory: Negative for shortness of  breath.   Cardiovascular: Negative for chest pain, palpitations and leg swelling.  Gastrointestinal: Negative for blood in stool and melena.    Physical Examination: BP 118/74 (BP Location: Right Arm, Patient Position: Sitting, Cuff Size: Normal)    Pulse (!) 59    Temp 97.5 F (36.4 C) (Oral)    Resp 17    Ht 5\' 6"  (1.676 m)    Wt 141 lb (64 kg)    SpO2 97%    BMI 22.76 kg/m  Ideal Body Weight: @FLOWAMB ([id number])@([phone])@  Physical Exam  Constitutional: He is oriented to person, place, and time. He appears well-developed and well-nourished. No distress.  HENT:  Head: Normocephalic and atraumatic.  Right Ear: Tympanic membrane, external ear and ear canal normal.  Left Ear: Tympanic membrane, external ear and ear canal normal.  Eyes: Conjunctivae and EOM are normal. Pupils are equal, round, and reactive to light.  Cardiovascular: Normal rate and regular rhythm.  Exam  reveals no friction rub.   No murmur heard. Pulmonary/Chest: Effort normal. No respiratory distress. He has no wheezes.  Abdominal: Soft. Bowel sounds are normal. He exhibits no distension and no mass. There is no tenderness. Hernia confirmed negative in the right inguinal area and confirmed negative in the left inguinal area.  Genitourinary:  Genitourinary Comments: Chaperone present.   Musculoskeletal: Normal range of motion. He exhibits no edema or tenderness.  Neurological: He is alert and oriented to person, place, and time. He displays normal reflexes.  Skin: Skin is warm and dry. He is not diaphoretic.  Psychiatric: He has a normal mood and affect. His behavior is normal.    Visual Acuity Screening   Right eye Left eye Both eyes  Without correction: 20/25 20/25 20/25   With correction:        Assessment and Plan: Pasty SpillersCharles E Chalmers is a 50 y.o. male who is here today for DOT 2 year DOT was given.  Encounter for examination required by Department of Transportation (DOT)  Trena PlattStephanie English, PA-C [facility] 10/11/20178:15 AM I personally performed the services described in this documentation, which was scribed in my presence. The recorded information has been reviewed and is accurate.

## 2015-12-13 NOTE — Patient Instructions (Signed)
     IF you received an x-ray today, you will receive an invoice from [facility]. Please contact [facility] at [phone] with questions or concerns regarding your invoice.   IF you received labwork today, you will receive an invoice from [facility]. Please contact [facility] at [phone] with questions or concerns regarding your invoice.   Our billing staff will not be able to assist you with questions regarding bills from these companies.  You will be contacted with the lab results as soon as they are available. The fastest way to get your results is to activate your My Chart account. Instructions are located on the last page of this paperwork. If you have not heard from us regarding the results in 2 weeks, please contact this office.      

## 2019-11-23 ENCOUNTER — Other Ambulatory Visit: Payer: Self-pay

## 2019-11-24 LAB — COMPREHENSIVE METABOLIC PANEL
ALT: 18 IU/L (ref 0–44)
AST: 20 IU/L (ref 0–40)
Albumin/Globulin Ratio: 2.2 (ref 1.2–2.2)
Albumin: 4.6 g/dL (ref 3.8–4.9)
Alkaline Phosphatase: 61 IU/L (ref 44–121)
BUN/Creatinine Ratio: 15 (ref 9–20)
BUN: 13 mg/dL (ref 6–24)
Bilirubin Total: 0.7 mg/dL (ref 0.0–1.2)
CO2: 25 mmol/L (ref 20–29)
Calcium: 9.6 mg/dL (ref 8.7–10.2)
Chloride: 103 mmol/L (ref 96–106)
Creatinine, Ser: 0.87 mg/dL (ref 0.76–1.27)
GFR calc Af Amer: 112 mL/min/{1.73_m2} (ref >59)
GFR calc non Af Amer: 97 mL/min/{1.73_m2} (ref >59)
Globulin, Total: 2.1 g/dL (ref 1.5–4.5)
Glucose: 72 mg/dL (ref 65–99)
Potassium: 5.2 mmol/L (ref 3.5–5.2)
Sodium: 142 mmol/L (ref 134–144)
Total Protein: 6.7 g/dL (ref 6.0–8.5)

## 2019-11-24 LAB — CBC WITH DIFFERENTIAL/PLATELET
Basophils Absolute: 0.1 10*3/uL (ref 0.0–0.2)
Basos: 1 %
EOS (ABSOLUTE): 0.1 10*3/uL (ref 0.0–0.4)
Eos: 1 %
Hematocrit: 41.7 % (ref 37.5–51.0)
Hemoglobin: 14.1 g/dL (ref 13.0–17.7)
Immature Grans (Abs): 0 10*3/uL (ref 0.0–0.1)
Immature Granulocytes: 0 %
Lymphocytes Absolute: 2.6 10*3/uL (ref 0.7–3.1)
Lymphs: 29 %
MCH: 32.9 pg (ref 26.6–33.0)
MCHC: 33.8 g/dL (ref 31.5–35.7)
MCV: 97 fL (ref 79–97)
Monocytes Absolute: 1.3 10*3/uL — ABNORMAL HIGH (ref 0.1–0.9)
Monocytes: 15 %
Neutrophils Absolute: 4.7 10*3/uL (ref 1.4–7.0)
Neutrophils: 54 %
Platelets: 231 10*3/uL (ref 150–450)
RBC: 4.28 x10E6/uL (ref 4.14–5.80)
RDW: 12.4 % (ref 11.6–15.4)
WBC: 8.7 10*3/uL (ref 3.4–10.8)

## 2019-11-24 LAB — LIPID PANEL W/O CHOL/HDL RATIO
Cholesterol, Total: 133 mg/dL (ref 100–199)
HDL: 57 mg/dL (ref >39)
LDL Chol Calc (NIH): 60 mg/dL (ref 0–99)
Triglycerides: 86 mg/dL (ref 0–149)
VLDL Cholesterol Cal: 16 mg/dL (ref 5–40)

## 2021-02-21 ENCOUNTER — Telehealth: Payer: Self-pay

## 2021-02-21 NOTE — Telephone Encounter (Signed)
Called pt to find out if he still a patient of Dr. Susann Givens. Last ov was 07/31/2012. No answer and vm full. KH
